# Patient Record
Sex: Female | Born: 1965 | Race: White | Hispanic: No | Marital: Single | Smoking: Never smoker
Health system: Southern US, Community
[De-identification: ages and names within clinical notes are randomized; demographics above are authoritative.]

## PROBLEM LIST (undated history)

## (undated) DIAGNOSIS — K5790 Diverticulosis of intestine, part unspecified, without perforation or abscess without bleeding: Secondary | ICD-10-CM

## (undated) DIAGNOSIS — L709 Acne, unspecified: Secondary | ICD-10-CM

## (undated) DIAGNOSIS — K635 Polyp of colon: Secondary | ICD-10-CM

## (undated) DIAGNOSIS — F32A Depression, unspecified: Secondary | ICD-10-CM

## (undated) DIAGNOSIS — T7840XA Allergy, unspecified, initial encounter: Secondary | ICD-10-CM

## (undated) DIAGNOSIS — K219 Gastro-esophageal reflux disease without esophagitis: Secondary | ICD-10-CM

## (undated) DIAGNOSIS — F329 Major depressive disorder, single episode, unspecified: Secondary | ICD-10-CM

## (undated) DIAGNOSIS — E079 Disorder of thyroid, unspecified: Secondary | ICD-10-CM

## (undated) DIAGNOSIS — K449 Diaphragmatic hernia without obstruction or gangrene: Secondary | ICD-10-CM

## (undated) DIAGNOSIS — E739 Lactose intolerance, unspecified: Secondary | ICD-10-CM

## (undated) DIAGNOSIS — E785 Hyperlipidemia, unspecified: Secondary | ICD-10-CM

## (undated) DIAGNOSIS — F419 Anxiety disorder, unspecified: Secondary | ICD-10-CM

## (undated) DIAGNOSIS — R946 Abnormal results of thyroid function studies: Secondary | ICD-10-CM

## (undated) DIAGNOSIS — Z975 Presence of (intrauterine) contraceptive device: Secondary | ICD-10-CM

## (undated) DIAGNOSIS — M1990 Unspecified osteoarthritis, unspecified site: Secondary | ICD-10-CM

## (undated) DIAGNOSIS — I10 Essential (primary) hypertension: Secondary | ICD-10-CM

## (undated) DIAGNOSIS — K2210 Ulcer of esophagus without bleeding: Secondary | ICD-10-CM

## (undated) HISTORY — DX: Ulcer of esophagus without bleeding: K22.10

## (undated) HISTORY — DX: Allergy, unspecified, initial encounter: T78.40XA

## (undated) HISTORY — DX: Essential (primary) hypertension: I10

## (undated) HISTORY — DX: Disorder of thyroid, unspecified: E07.9

## (undated) HISTORY — PX: BILATERAL KNEE ARTHROSCOPY: SUR91

## (undated) HISTORY — DX: Unspecified osteoarthritis, unspecified site: M19.90

## (undated) HISTORY — DX: Major depressive disorder, single episode, unspecified: F32.9

## (undated) HISTORY — DX: Diverticulosis of intestine, part unspecified, without perforation or abscess without bleeding: K57.90

## (undated) HISTORY — DX: Depression, unspecified: F32.A

## (undated) HISTORY — PX: TONSILLECTOMY AND ADENOIDECTOMY: SUR1326

## (undated) HISTORY — DX: Gastro-esophageal reflux disease without esophagitis: K21.9

## (undated) HISTORY — DX: Anxiety disorder, unspecified: F41.9

## (undated) HISTORY — PX: GANGLION CYST EXCISION: SHX1691

## (undated) HISTORY — DX: Lactose intolerance, unspecified: E73.9

## (undated) HISTORY — DX: Hyperlipidemia, unspecified: E78.5

## (undated) HISTORY — DX: Polyp of colon: K63.5

## (undated) HISTORY — DX: Abnormal results of thyroid function studies: R94.6

## (undated) HISTORY — PX: OTHER SURGICAL HISTORY: SHX169

## (undated) HISTORY — DX: Diaphragmatic hernia without obstruction or gangrene: K44.9

## (undated) HISTORY — DX: Presence of (intrauterine) contraceptive device: Z97.5

## (undated) HISTORY — DX: Acne, unspecified: L70.9

---

## 2000-06-26 ENCOUNTER — Other Ambulatory Visit: Admission: RE | Admit: 2000-06-26 | Discharge: 2000-06-26 | Payer: Self-pay | Admitting: Obstetrics and Gynecology

## 2001-07-25 ENCOUNTER — Other Ambulatory Visit: Admission: RE | Admit: 2001-07-25 | Discharge: 2001-07-25 | Payer: Self-pay | Admitting: Obstetrics and Gynecology

## 2002-07-28 ENCOUNTER — Inpatient Hospital Stay (HOSPITAL_COMMUNITY): Admission: AD | Admit: 2002-07-28 | Discharge: 2002-07-31 | Payer: Self-pay | Admitting: Obstetrics and Gynecology

## 2002-08-08 ENCOUNTER — Encounter: Admission: RE | Admit: 2002-08-08 | Discharge: 2002-09-07 | Payer: Self-pay | Admitting: Obstetrics and Gynecology

## 2002-09-09 ENCOUNTER — Other Ambulatory Visit: Admission: RE | Admit: 2002-09-09 | Discharge: 2002-09-09 | Payer: Self-pay | Admitting: Obstetrics and Gynecology

## 2003-11-03 ENCOUNTER — Other Ambulatory Visit: Admission: RE | Admit: 2003-11-03 | Discharge: 2003-11-03 | Payer: Self-pay | Admitting: Obstetrics and Gynecology

## 2004-10-06 ENCOUNTER — Ambulatory Visit: Payer: Self-pay | Admitting: Internal Medicine

## 2004-10-14 ENCOUNTER — Ambulatory Visit: Payer: Self-pay | Admitting: Internal Medicine

## 2004-12-19 ENCOUNTER — Other Ambulatory Visit: Admission: RE | Admit: 2004-12-19 | Discharge: 2004-12-19 | Payer: Self-pay | Admitting: Obstetrics and Gynecology

## 2005-03-15 ENCOUNTER — Ambulatory Visit: Payer: Self-pay | Admitting: Internal Medicine

## 2005-11-14 ENCOUNTER — Ambulatory Visit: Payer: Self-pay | Admitting: Internal Medicine

## 2007-01-16 ENCOUNTER — Encounter: Payer: Self-pay | Admitting: Internal Medicine

## 2007-01-23 ENCOUNTER — Ambulatory Visit: Payer: Self-pay | Admitting: Internal Medicine

## 2007-05-28 ENCOUNTER — Encounter: Payer: Self-pay | Admitting: Internal Medicine

## 2007-11-25 ENCOUNTER — Encounter: Payer: Self-pay | Admitting: Internal Medicine

## 2008-01-24 ENCOUNTER — Encounter: Payer: Self-pay | Admitting: Internal Medicine

## 2008-03-16 ENCOUNTER — Telehealth (INDEPENDENT_AMBULATORY_CARE_PROVIDER_SITE_OTHER): Payer: Self-pay | Admitting: *Deleted

## 2008-03-19 ENCOUNTER — Ambulatory Visit: Payer: Self-pay | Admitting: Internal Medicine

## 2008-03-24 DIAGNOSIS — J45909 Unspecified asthma, uncomplicated: Secondary | ICD-10-CM | POA: Insufficient documentation

## 2008-03-24 DIAGNOSIS — E739 Lactose intolerance, unspecified: Secondary | ICD-10-CM | POA: Insufficient documentation

## 2008-03-24 DIAGNOSIS — R946 Abnormal results of thyroid function studies: Secondary | ICD-10-CM | POA: Insufficient documentation

## 2008-03-24 DIAGNOSIS — Z9089 Acquired absence of other organs: Secondary | ICD-10-CM | POA: Insufficient documentation

## 2008-03-24 DIAGNOSIS — L719 Rosacea, unspecified: Secondary | ICD-10-CM | POA: Insufficient documentation

## 2008-03-26 ENCOUNTER — Ambulatory Visit: Payer: Self-pay | Admitting: Internal Medicine

## 2008-03-26 DIAGNOSIS — N943 Premenstrual tension syndrome: Secondary | ICD-10-CM | POA: Insufficient documentation

## 2008-03-26 DIAGNOSIS — E785 Hyperlipidemia, unspecified: Secondary | ICD-10-CM | POA: Insufficient documentation

## 2008-03-26 LAB — CONVERTED CEMR LAB
Direct LDL: 143.4 mg/dL
Free T4: 0.8 ng/dL (ref 0.6–1.6)
HDL: 73.8 mg/dL (ref >39.0)
Triglycerides: 105 mg/dL (ref 0–149)
VLDL: 21 mg/dL (ref 0–40)

## 2008-03-27 ENCOUNTER — Encounter: Payer: Self-pay | Admitting: Internal Medicine

## 2008-03-31 ENCOUNTER — Encounter (INDEPENDENT_AMBULATORY_CARE_PROVIDER_SITE_OTHER): Payer: Self-pay | Admitting: *Deleted

## 2008-04-14 ENCOUNTER — Telehealth (INDEPENDENT_AMBULATORY_CARE_PROVIDER_SITE_OTHER): Payer: Self-pay | Admitting: *Deleted

## 2008-05-04 ENCOUNTER — Ambulatory Visit: Payer: Self-pay | Admitting: Internal Medicine

## 2008-05-04 ENCOUNTER — Telehealth (INDEPENDENT_AMBULATORY_CARE_PROVIDER_SITE_OTHER): Payer: Self-pay | Admitting: *Deleted

## 2008-05-05 ENCOUNTER — Encounter: Payer: Self-pay | Admitting: Family Medicine

## 2008-05-07 ENCOUNTER — Telehealth (INDEPENDENT_AMBULATORY_CARE_PROVIDER_SITE_OTHER): Payer: Self-pay | Admitting: *Deleted

## 2008-05-08 ENCOUNTER — Encounter (INDEPENDENT_AMBULATORY_CARE_PROVIDER_SITE_OTHER): Payer: Self-pay | Admitting: *Deleted

## 2008-05-12 ENCOUNTER — Ambulatory Visit: Payer: Self-pay | Admitting: Internal Medicine

## 2008-06-08 ENCOUNTER — Encounter: Admission: RE | Admit: 2008-06-08 | Discharge: 2008-06-08 | Payer: Self-pay | Admitting: Obstetrics and Gynecology

## 2008-06-30 ENCOUNTER — Ambulatory Visit: Payer: Self-pay | Admitting: Internal Medicine

## 2008-08-05 ENCOUNTER — Ambulatory Visit: Payer: Self-pay | Admitting: Internal Medicine

## 2008-08-09 LAB — CONVERTED CEMR LAB
ALT: 14 units/L (ref 0–35)
Cholesterol: 197 mg/dL (ref 0–200)
LDL Cholesterol: 96 mg/dL (ref 0–99)
Total CHOL/HDL Ratio: 2.4
Total CK: 39 units/L (ref 7–177)
Triglycerides: 90 mg/dL (ref 0–149)
VLDL: 18 mg/dL (ref 0–40)

## 2008-08-10 ENCOUNTER — Encounter (INDEPENDENT_AMBULATORY_CARE_PROVIDER_SITE_OTHER): Payer: Self-pay | Admitting: *Deleted

## 2008-09-02 ENCOUNTER — Ambulatory Visit: Payer: Self-pay | Admitting: Internal Medicine

## 2009-01-07 ENCOUNTER — Encounter: Admission: RE | Admit: 2009-01-07 | Discharge: 2009-01-07 | Payer: Self-pay | Admitting: Physician Assistant

## 2009-01-27 ENCOUNTER — Encounter: Payer: Self-pay | Admitting: Internal Medicine

## 2009-03-08 ENCOUNTER — Telehealth (INDEPENDENT_AMBULATORY_CARE_PROVIDER_SITE_OTHER): Payer: Self-pay | Admitting: *Deleted

## 2009-03-10 ENCOUNTER — Telehealth (INDEPENDENT_AMBULATORY_CARE_PROVIDER_SITE_OTHER): Payer: Self-pay | Admitting: *Deleted

## 2009-05-12 ENCOUNTER — Ambulatory Visit: Payer: Self-pay | Admitting: Internal Medicine

## 2009-05-12 DIAGNOSIS — M1990 Unspecified osteoarthritis, unspecified site: Secondary | ICD-10-CM | POA: Insufficient documentation

## 2009-05-12 DIAGNOSIS — L259 Unspecified contact dermatitis, unspecified cause: Secondary | ICD-10-CM | POA: Insufficient documentation

## 2009-05-12 DIAGNOSIS — F329 Major depressive disorder, single episode, unspecified: Secondary | ICD-10-CM

## 2009-05-12 DIAGNOSIS — IMO0001 Reserved for inherently not codable concepts without codable children: Secondary | ICD-10-CM | POA: Insufficient documentation

## 2009-05-12 DIAGNOSIS — F32A Depression, unspecified: Secondary | ICD-10-CM | POA: Insufficient documentation

## 2009-05-12 DIAGNOSIS — M2550 Pain in unspecified joint: Secondary | ICD-10-CM | POA: Insufficient documentation

## 2009-05-12 HISTORY — DX: Depression, unspecified: F32.A

## 2009-05-13 ENCOUNTER — Encounter: Payer: Self-pay | Admitting: Internal Medicine

## 2009-05-13 ENCOUNTER — Encounter (INDEPENDENT_AMBULATORY_CARE_PROVIDER_SITE_OTHER): Payer: Self-pay | Admitting: *Deleted

## 2009-05-27 ENCOUNTER — Encounter (INDEPENDENT_AMBULATORY_CARE_PROVIDER_SITE_OTHER): Payer: Self-pay | Admitting: *Deleted

## 2009-05-27 ENCOUNTER — Telehealth (INDEPENDENT_AMBULATORY_CARE_PROVIDER_SITE_OTHER): Payer: Self-pay | Admitting: *Deleted

## 2009-06-04 ENCOUNTER — Ambulatory Visit: Payer: Self-pay | Admitting: Internal Medicine

## 2009-06-07 ENCOUNTER — Encounter (INDEPENDENT_AMBULATORY_CARE_PROVIDER_SITE_OTHER): Payer: Self-pay | Admitting: *Deleted

## 2009-07-08 ENCOUNTER — Encounter: Admission: RE | Admit: 2009-07-08 | Discharge: 2009-07-08 | Payer: Self-pay | Admitting: Obstetrics and Gynecology

## 2009-07-30 ENCOUNTER — Ambulatory Visit: Payer: Self-pay | Admitting: Internal Medicine

## 2009-08-01 LAB — CONVERTED CEMR LAB
Albumin: 3.7 g/dL (ref 3.5–5.2)
Alkaline Phosphatase: 51 units/L (ref 39–117)
Cholesterol: 199 mg/dL (ref 0–200)
HDL: 76.6 mg/dL (ref >39.00)
LDL Cholesterol: 105 mg/dL — ABNORMAL HIGH (ref 0–99)
T3, Free: 2.7 pg/mL (ref 2.3–4.2)
TSH: 0.6 microintl units/mL (ref 0.35–5.50)
Total Protein: 6.4 g/dL (ref 6.0–8.3)
Triglycerides: 87 mg/dL (ref 0.0–149.0)

## 2009-08-02 ENCOUNTER — Encounter (INDEPENDENT_AMBULATORY_CARE_PROVIDER_SITE_OTHER): Payer: Self-pay | Admitting: *Deleted

## 2009-08-17 ENCOUNTER — Ambulatory Visit: Payer: Self-pay | Admitting: Internal Medicine

## 2009-09-06 ENCOUNTER — Telehealth (INDEPENDENT_AMBULATORY_CARE_PROVIDER_SITE_OTHER): Payer: Self-pay | Admitting: *Deleted

## 2010-01-10 IMAGING — CR DG HAND COMPLETE 3+V*R*
3 series · 3 of 3 positions shown · non-contrast
Comparison: None available.

CLINICAL DATA: Hand pain.

RIGHT HAND - COMPLETE 3+ VIEW

[view not recorded (1 of 3)]
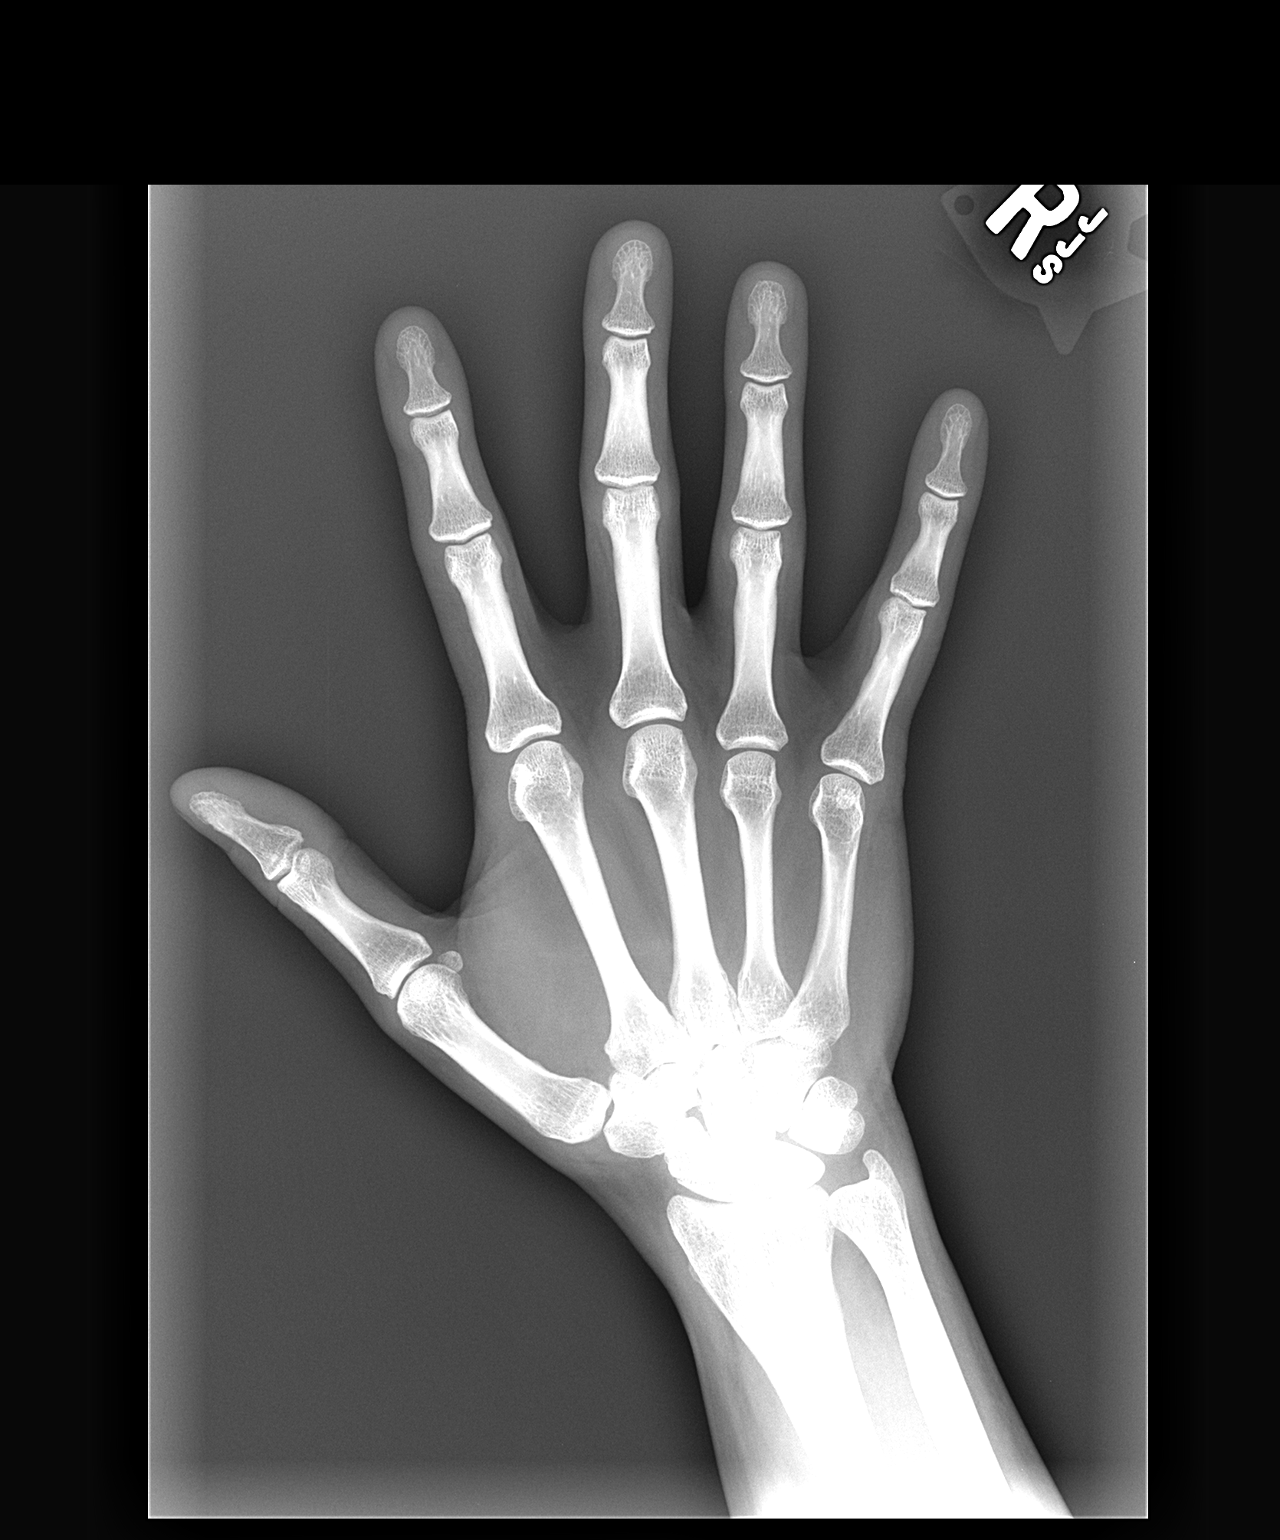

[view not recorded (2 of 3)]
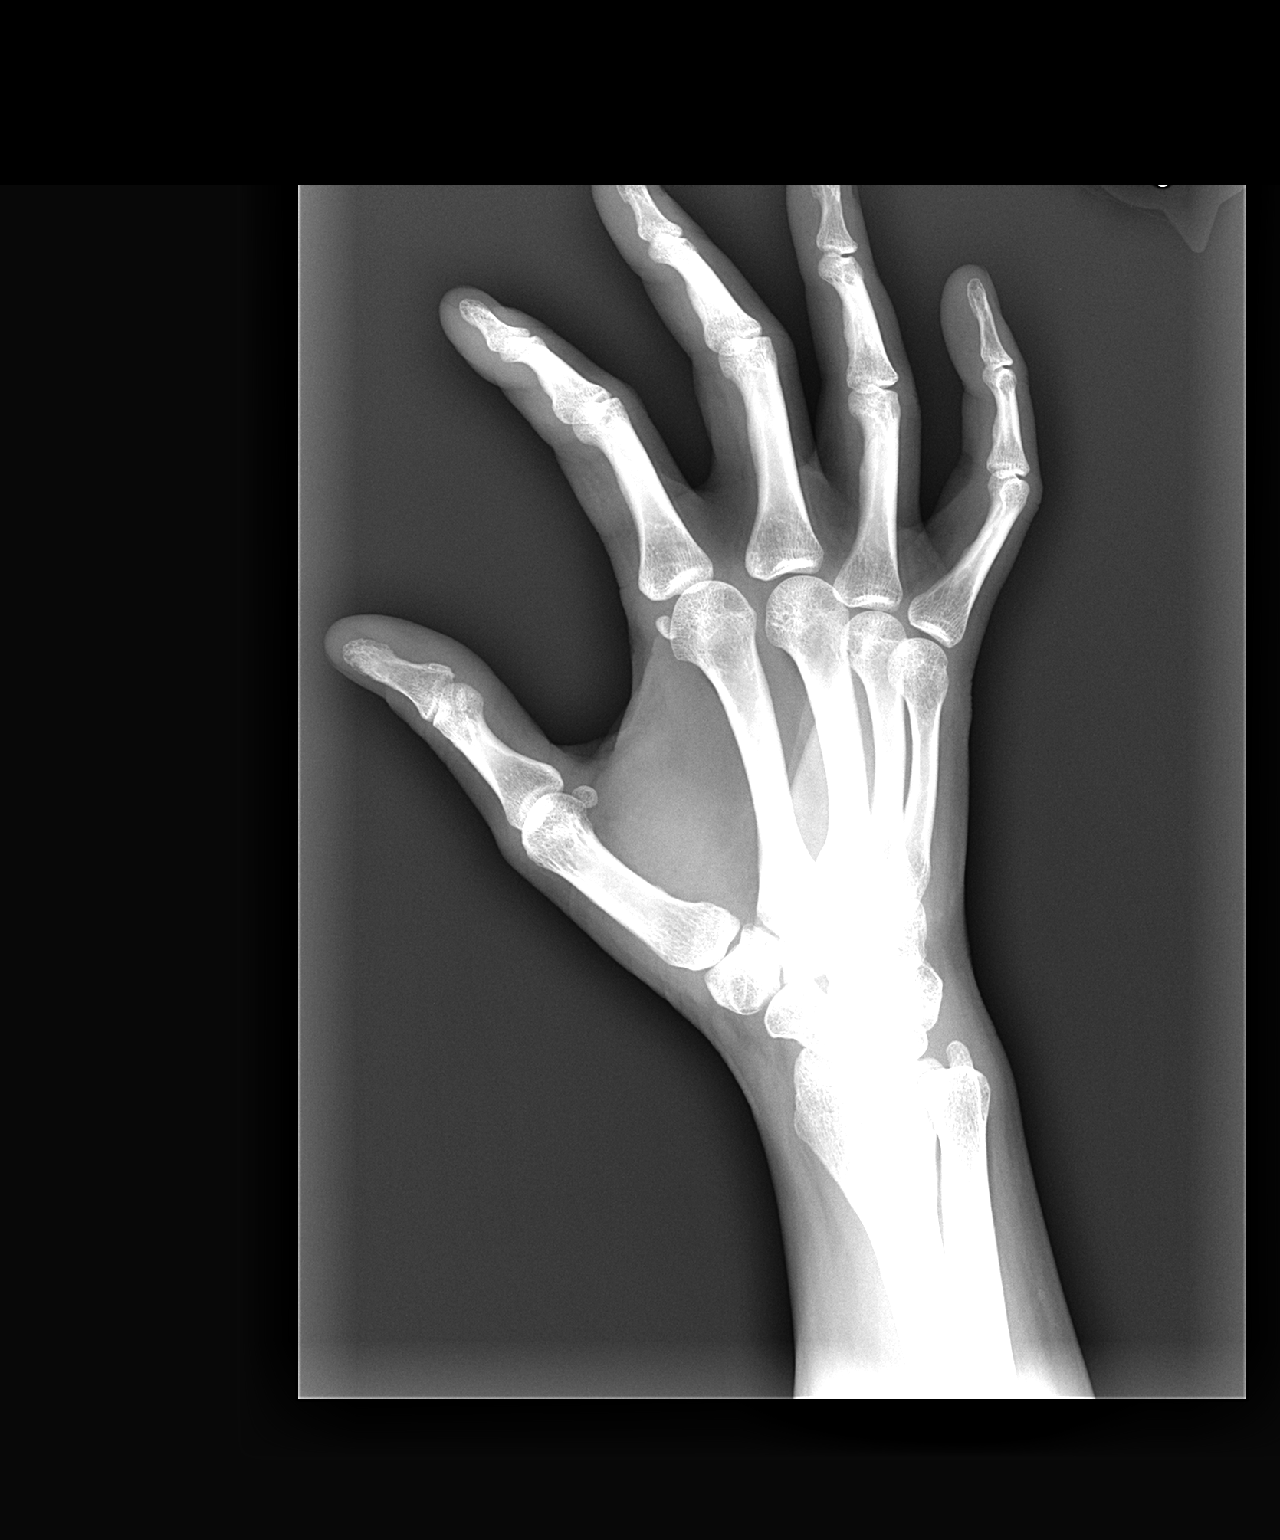

[view not recorded (3 of 3)]
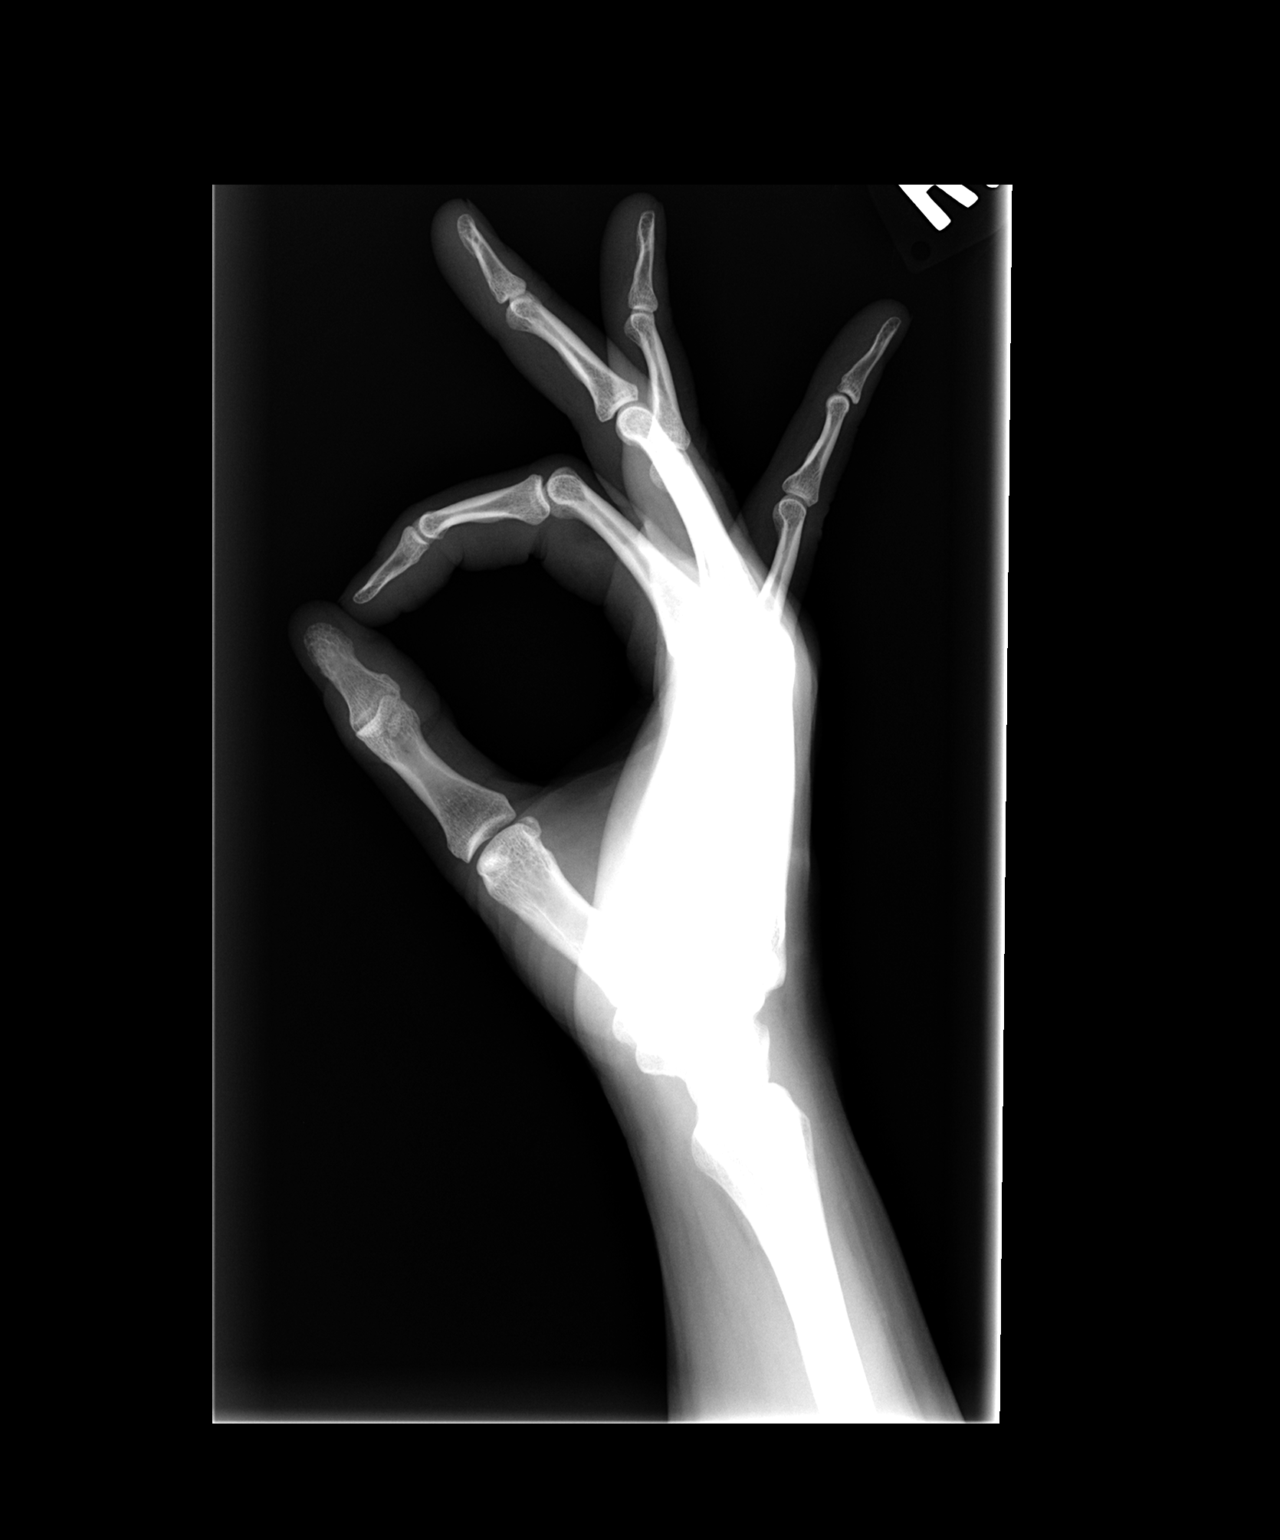

[3 of 3 positions shown; findings below may reference images not displayed]

FINDINGS: Imaged bones, joints and soft tissues appear normal.
IMPRESSION: Negative exam.

## 2010-01-10 IMAGING — CR DG HAND COMPLETE 3+V*L*
3 series · 3 of 3 positions shown · non-contrast
Comparison: None available.

CLINICAL DATA: Hand pain.

LEFT HAND - COMPLETE 3+ VIEW

[view not recorded (1 of 3)]
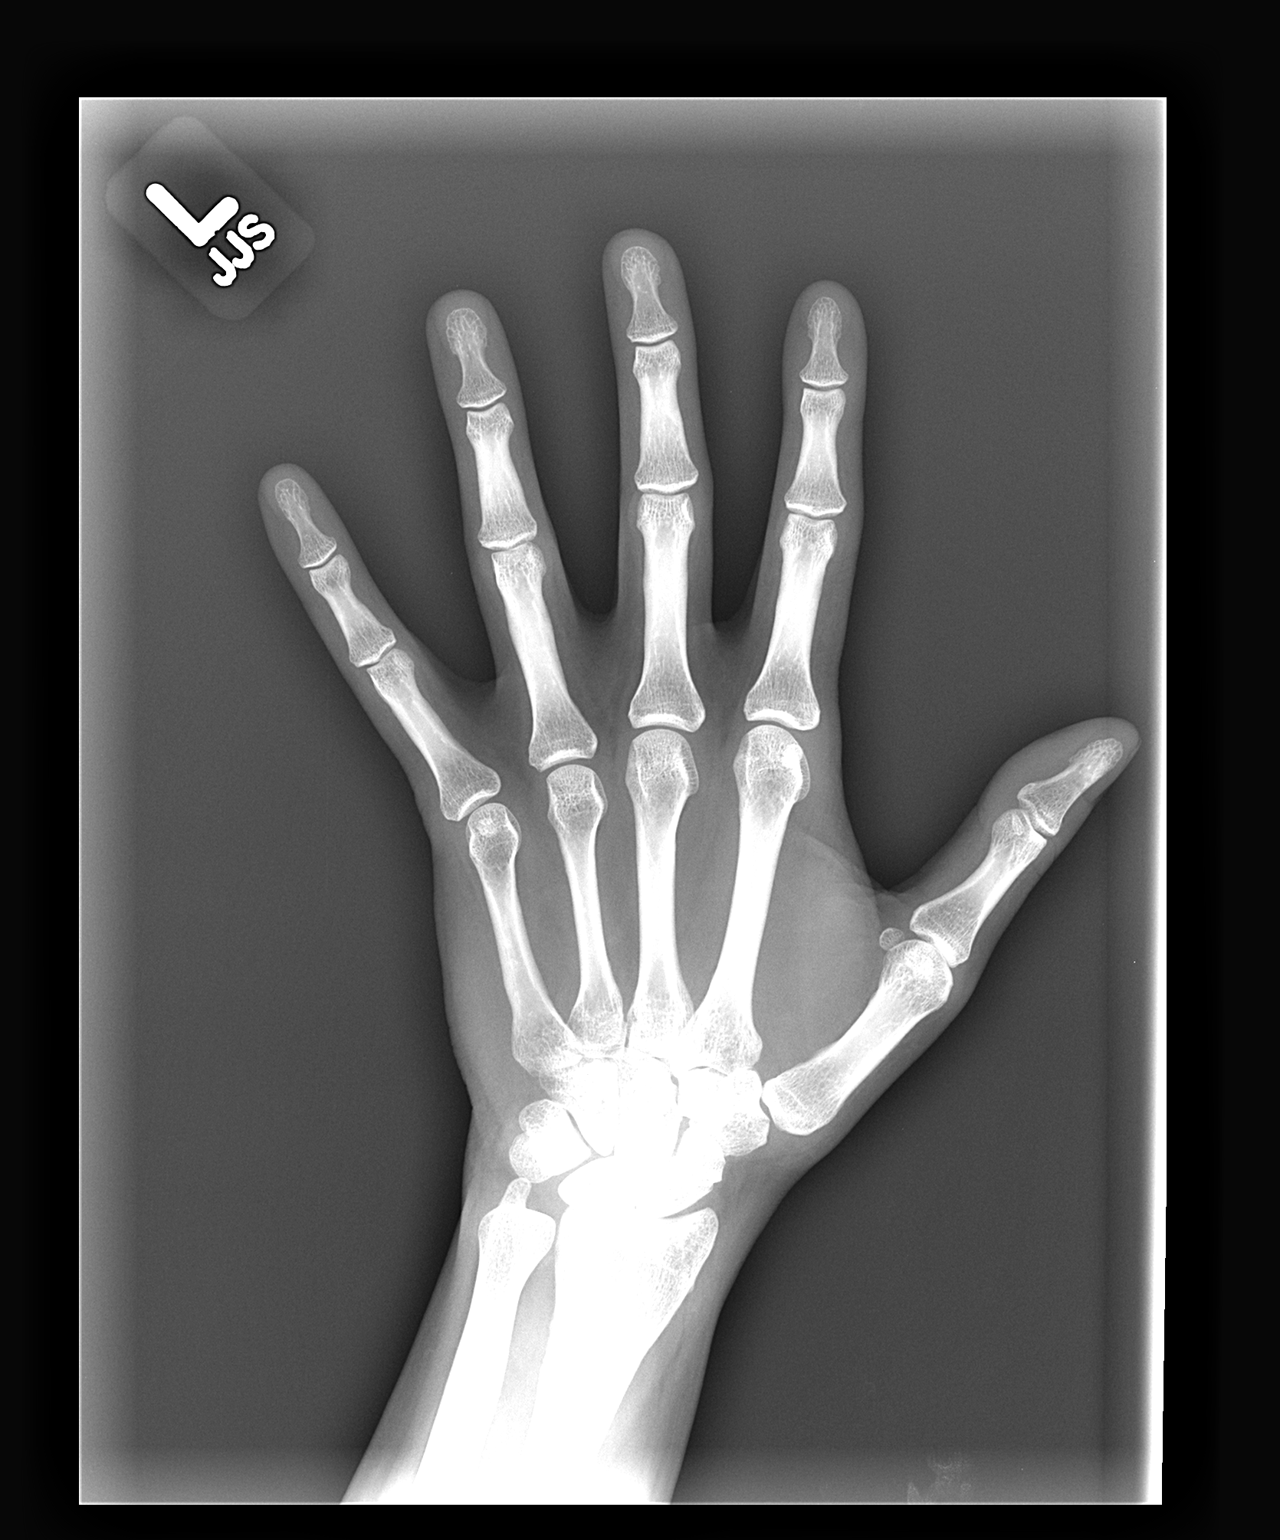

[view not recorded (2 of 3)]
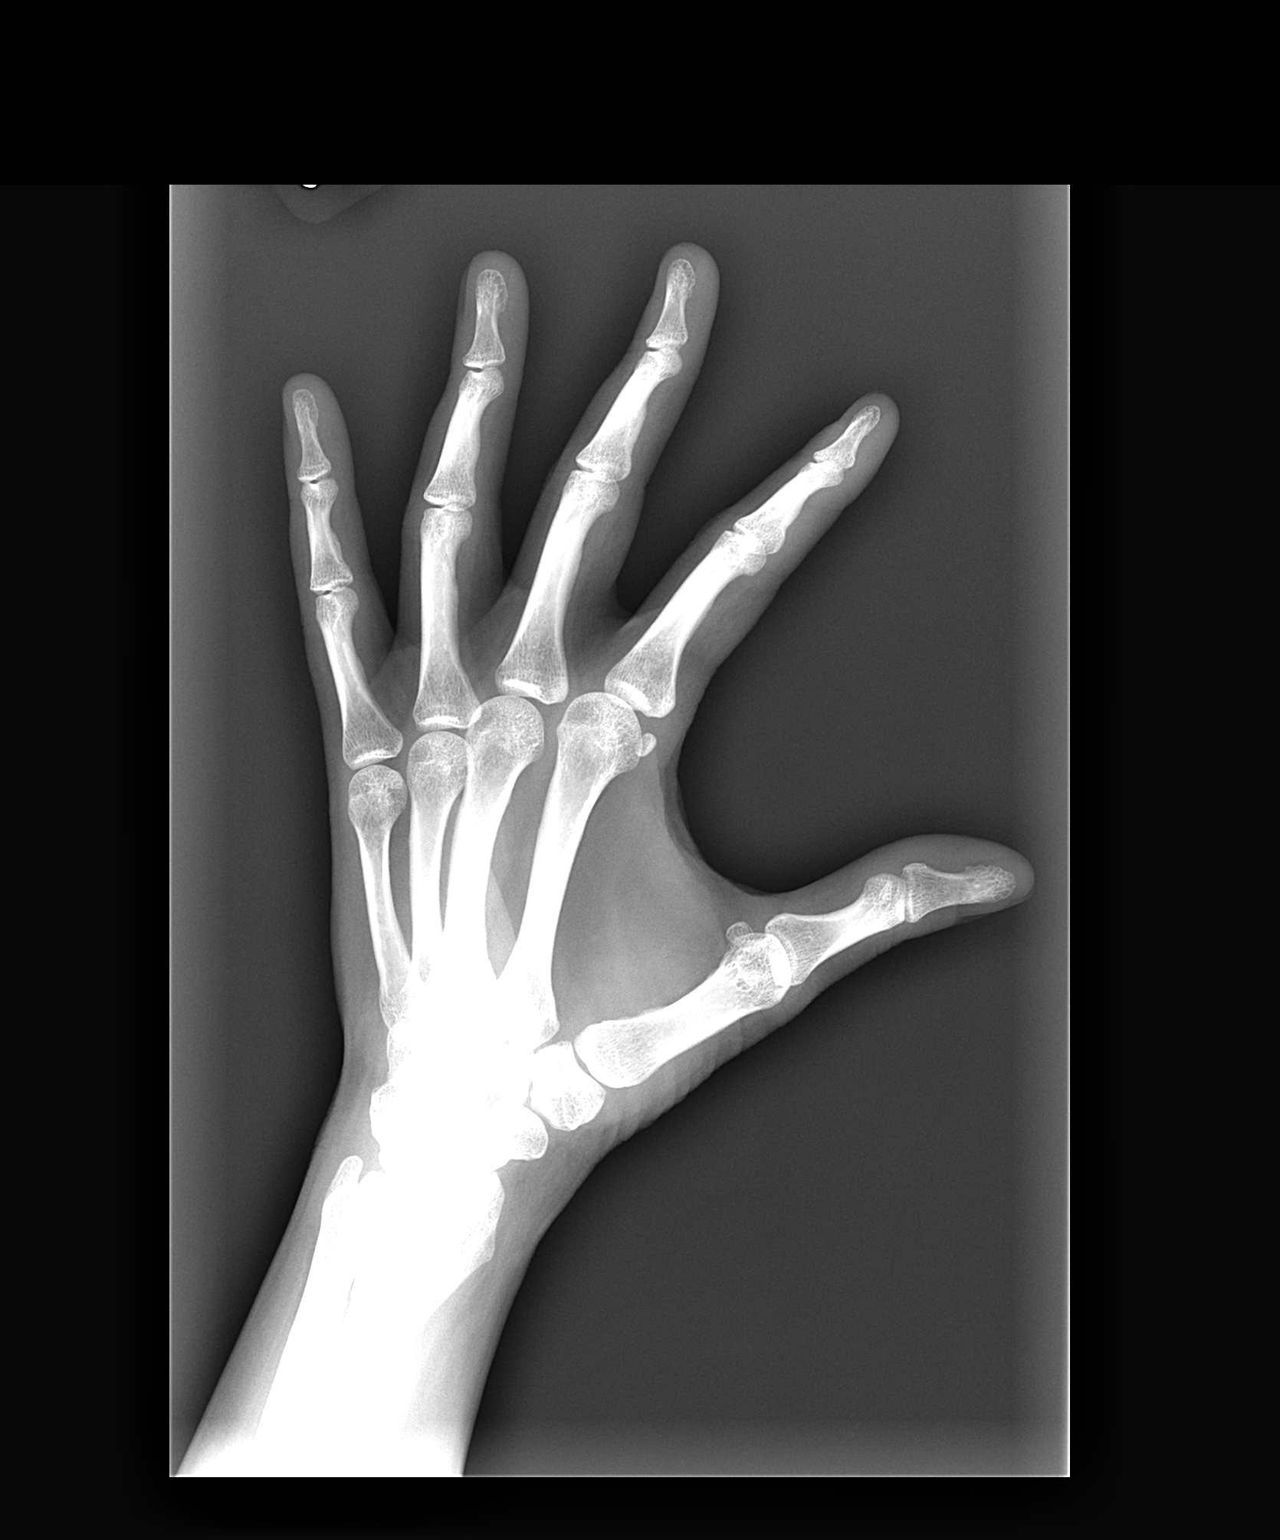

[view not recorded (3 of 3)]
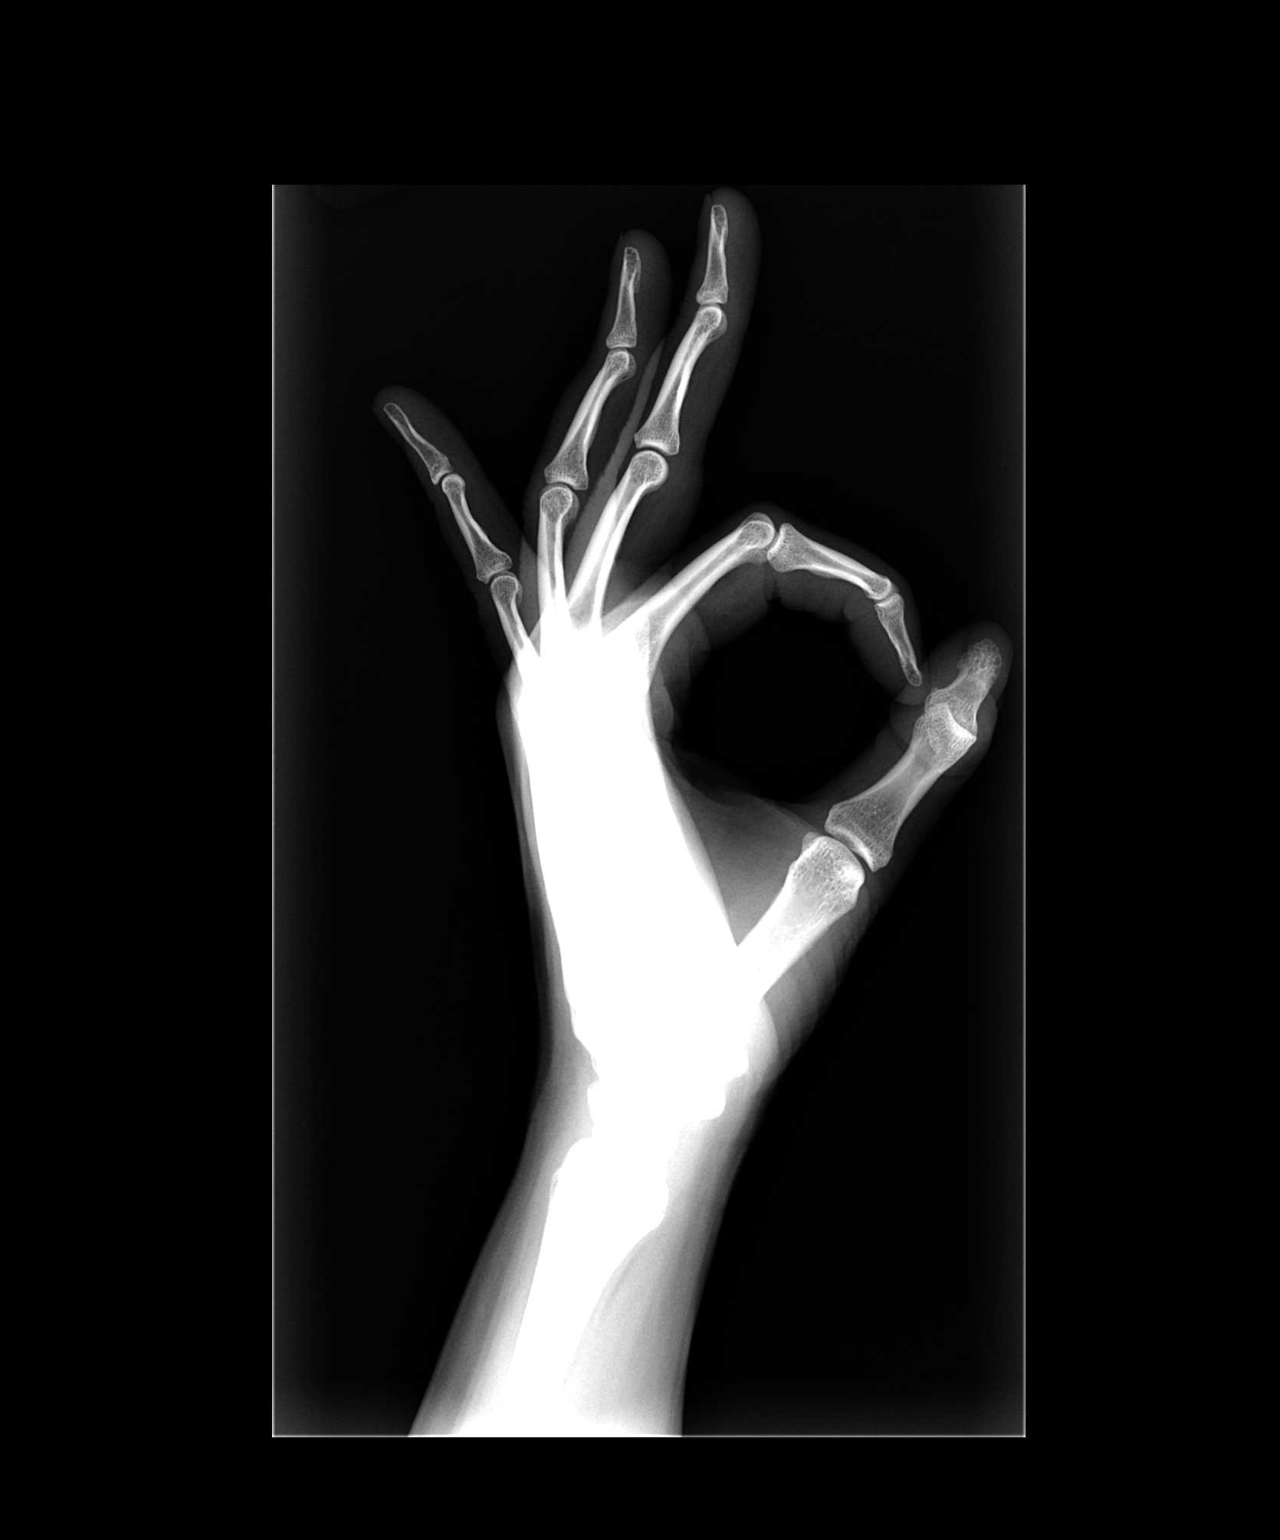

[3 of 3 positions shown; findings below may reference images not displayed]

FINDINGS: Imaged bones, joints and soft tissues appear normal.
IMPRESSION: Negative exam.

## 2010-01-12 ENCOUNTER — Encounter: Payer: Self-pay | Admitting: Internal Medicine

## 2010-01-13 ENCOUNTER — Telehealth (INDEPENDENT_AMBULATORY_CARE_PROVIDER_SITE_OTHER): Payer: Self-pay | Admitting: *Deleted

## 2010-01-18 ENCOUNTER — Ambulatory Visit: Payer: Self-pay | Admitting: Internal Medicine

## 2010-01-18 DIAGNOSIS — R42 Dizziness and giddiness: Secondary | ICD-10-CM | POA: Insufficient documentation

## 2010-04-14 ENCOUNTER — Telehealth (INDEPENDENT_AMBULATORY_CARE_PROVIDER_SITE_OTHER): Payer: Self-pay | Admitting: *Deleted

## 2010-06-16 ENCOUNTER — Telehealth (INDEPENDENT_AMBULATORY_CARE_PROVIDER_SITE_OTHER): Payer: Self-pay | Admitting: *Deleted

## 2010-09-21 ENCOUNTER — Encounter: Payer: Self-pay | Admitting: Internal Medicine

## 2010-09-21 ENCOUNTER — Ambulatory Visit: Payer: Self-pay | Admitting: Internal Medicine

## 2010-10-03 ENCOUNTER — Encounter: Payer: Self-pay | Admitting: Internal Medicine

## 2010-10-19 ENCOUNTER — Telehealth: Payer: Self-pay | Admitting: Internal Medicine

## 2010-11-03 ENCOUNTER — Encounter: Payer: Self-pay | Admitting: Internal Medicine

## 2010-12-18 LAB — CONVERTED CEMR LAB
ALT: 21 units/L (ref 0–35)
AST: 34 units/L (ref 0–37)
Albumin: 4 g/dL (ref 3.5–5.2)
Alkaline Phosphatase: 38 units/L — ABNORMAL LOW (ref 39–117)
Basophils Absolute: 0 10*3/uL (ref 0.0–0.1)
Basophils Absolute: 0 10*3/uL (ref 0.0–0.1)
Basophils Relative: 0.4 % (ref 0.0–3.0)
Calcium: 9 mg/dL (ref 8.4–10.5)
Calcium: 9.4 mg/dL (ref 8.4–10.5)
Eosinophils Absolute: 0.3 10*3/uL (ref 0.0–0.6)
Eosinophils Relative: 0.8 % (ref 0.0–5.0)
Free T4: 0.7 ng/dL (ref 0.6–1.6)
GFR calc non Af Amer: 97.46 mL/min (ref >60)
Glucose, Bld: 98 mg/dL (ref 70–99)
HCT: 37.1 % (ref 36.0–46.0)
HDL: 100.6 mg/dL (ref >39.00)
Hemoglobin: 13 g/dL (ref 12.0–15.0)
Lymphocytes Relative: 16.5 % (ref 12.0–46.0)
Lymphs Abs: 1.4 10*3/uL (ref 0.7–4.0)
MCHC: 35 g/dL (ref 30.0–36.0)
MCV: 94.8 fL (ref 78.0–100.0)
Magnesium: 2 mg/dL (ref 1.5–2.5)
Monocytes Relative: 4.4 % (ref 3.0–12.0)
Monocytes Relative: 5.8 % (ref 3.0–11.0)
Neutro Abs: 6.3 10*3/uL (ref 1.4–7.7)
Platelets: 231 10*3/uL (ref 150–400)
Potassium: 4.2 meq/L (ref 3.5–5.1)
RBC: 3.86 M/uL — ABNORMAL LOW (ref 3.87–5.11)
RBC: 3.94 M/uL (ref 3.87–5.11)
RDW: 11.9 % (ref 11.5–14.6)
Rhuematoid fact SerPl-aCnc: <20 intl units/mL (ref 0.0–20.0)
Sodium: 141 meq/L (ref 135–145)
TSH: 0.32 microintl units/mL — ABNORMAL LOW (ref 0.35–5.50)
Total CHOL/HDL Ratio: 2
Total Protein: 7.4 g/dL (ref 6.0–8.3)
Triglycerides: 80 mg/dL (ref 0.0–149.0)

## 2010-12-20 NOTE — Progress Notes (Signed)
Summary: RX  Phone Note Call from Patient Call back at [facility] Phone [phone]   Caller: Patient Summary of Call: PT CALL SAYING SHE NEED THE SCRIPT TO BE WRITTEN OR SEND IN  TABLETS  NOT CAPSULE . CAN YOU PLEASE SEND A NEW SCRIPT WRITTEN FOR TABLETS Initial call taken by: Freddy Jaksch,  June 16, 2010 11:12 AM  Follow-up for Phone Call        I called the pharmacy and informed them to change to a tab, when I keyed in efferox the only option was a cap but once I keyed in the generic it did come up as a tab  I called and informed patient I called the pharmacy and advised patient again she is due for a yearly wellness check. Patient stated she will call back to schedule b/c she didnt have her calender in front of her Follow-up by: Shonna Chock CMA,  June 16, 2010 11:42 AM    New/Updated Medications: VENLAFAXINE HCL 75 MG TABS (VENLAFAXINE HCL) 1 by mouth once daily

## 2010-12-20 NOTE — Progress Notes (Signed)
Summary: Refill Request  Phone Note Refill Request Call back at Home Phone [phone] Message from:  Patient  Refills Requested: Medication #1:  CRESTOR 20 MG TABS take as directed. [facility]   Method Requested: Fax to Local Pharmacy Initial call taken by: Shonna Chock,  January 13, 2010 4:52 PM    Prescriptions: CRESTOR 20 MG TABS (ROSUVASTATIN CALCIUM) take as directed  #30 x 6   Entered by:   Shonna Chock   Authorized by:   Marga Melnick MD   Signed by:   Shonna Chock on 01/13/2010   Method used:   Electronically to        [facility] [phone]* (retail)       [address]       [place], [place]  [address]       Ph: [phone]       Fax: [phone]   RxID:   [id number]

## 2010-12-20 NOTE — Progress Notes (Signed)
Summary: Rx Change  Phone Note Call from Patient Call back at Home Phone [phone]   Caller: Patient Summary of Call: Patient called and LM on triage VM asking for a new rx for her Vanlafaxine HCL 75mg . She would like this in tablets if it is avaible because it would be cheaper. Her pharmacy is [facility] [address]. She would like a call to let her know that if this is possible or if it was sent.  Initial call taken by: Harold Barban,  June 16, 2010 8:42 AM  Follow-up for Phone Call        Left mesaage on VM informing patient rx sent to pharmacy, it only comes up in our system as a Cap, I also left message informing patient that she is due for a yearly wellness check Follow-up by: Shonna Chock CMA,  June 16, 2010 10:26 AM    Prescriptions: EFFEXOR XR 75 MG  CP24 (VENLAFAXINE HCL) 1 by mouth once daily GENERIC OK  #90 x 0   Entered by:   Shonna Chock CMA   Authorized by:   Marga Melnick MD   Signed by:   Shonna Chock CMA on 06/16/2010   Method used:   Faxed to ...       [facility] [address] (retail)       [phone]       [place], [place]  [address]       Ph: [phone]       Fax: [phone]   RxID:   [id number]

## 2010-12-20 NOTE — Assessment & Plan Note (Signed)
Summary: dizzy spells//lch   Vital Signs:  Patient profile:   45 year old female Weight:      185 pounds Temp:     98.3 degrees F oral Pulse rate:   88 / minute Resp:     16 per minute BP supine:   124 / 80  (left arm) BP sitting:   128 / 80  (left arm) BP standing:   124 / 82  (left arm) Cuff size:   large  Vitals Entered By: Shonna Chock (January 18, 2010 3:05 PM) CC: 1.) Dizzy: Onset yesterday, patient dizzy when turning head and ears feel funny (? Sinuses). Patient also felt nauseated yesterday  2.) New Rx's for Effexor and Crestor for new insurance Comments REVIEWED MED LIST, PATIENT AGREED DOSE AND INSTRUCTION CORRECT    CC:  1.) Dizzy: Onset yesterday and patient dizzy when turning head and ears feel funny (? Sinuses). Patient also felt nauseated yesterday  2.) New Rx's for Effexor and Crestor for new insurance.  History of Present Illness: Acute onset of dizziness 01/17/2010; intermittent recurrences for seconds with nausea through day. Today worse as more "balloon feeling " in head. No triggers or relievers. No prior hx of vertigo or BPV.  Allergies: 1)  ! Penicillin  Review of Systems General:  Denies chills, fever, and sweats; Flushing. Eyes:  Complains of blurring; denies double vision and vision loss-both eyes. ENT:  Denies decreased hearing, nasal congestion, ringing in ears, and sinus pressure; No frontal headaches , facial pain, purulence. CV:  Denies chest pain or discomfort and palpitations. Derm:  Denies lesion(s) and rash. Neuro:  Denies brief paralysis, disturbances in coordination, falling down, inability to speak, numbness, poor balance, sensation of room spinning, tingling, tremors, and weakness; Mild pressure. Psych:  Complains of anxiety; Work stress. Allergy:  Denies itching eyes and sneezing.  Physical Exam  General:  well-nourished,in no acute distress; alert,appropriate and cooperative throughout examination Eyes:  No corneal or conjunctival  inflammation noted. EOMI. Perrla. Field of  Vision grossly normal.No nystagmus Ears:  External ear exam shows no significant lesions or deformities.  Otoscopic examination reveals clear canals, tympanic membranes are intact bilaterally without bulging, retraction, inflammation or discharge. Hearing is grossly normal bilaterally.Tuning fork exam WNL Nose:  External nasal examination shows no deformity or inflammation. Nasal mucosa are pink and moist without lesions or exudates. Mouth:  Oral mucosa and oropharynx without lesions or exudates.  Teeth in good repair. Heart:  Normal rate and regular rhythm. S1 and S2 normal without gallop, murmur, click, rub or other extra sounds. Pulses:  R and L carotid  pulses are full and equal bilaterallyw/o bruits Extremities:  No clubbing, cyanosis, edema, or deformity noted  Neurologic:  alert & oriented X3, cranial nerves II-XII intact, strength normal in all extremities, sensation intact to light touch, gait (heel & toe)normal, DTRs symmetrical and normal, finger-to-nose normal, heel-to-shin normal, and Romberg negative.  Rapid speech & repetitive motion WNL. No tremor Skin:  Intact without suspicious lesions or rashes Cervical Nodes:  No lymphadenopathy noted Axillary Nodes:  No palpable lymphadenopathy Psych:  memory intact for recent and remote, normally interactive, good eye contact, and not anxious appearing.     Impression & Recommendations:  Problem # 1:  DIZZINESS (ICD-780.4)  Her updated medication list for this problem includes:    Xyzal 5 Mg Tabs (Levocetirizine dihydrochloride) .Marland Kitchen... 1 by mouth once daily  Complete Medication List: 1)  Effexor Xr 75 Mg Cp24 (venlafaxine Hcl)  .Marland KitchenMarland KitchenMarland Kitchen 1  by mouth once daily generic ok 2)  Singulair 10 Mg Tabs (Montelukast sodium) .... Qd 3)  Xyzal 5 Mg Tabs (Levocetirizine dihydrochloride) .Marland Kitchen.. 1 by mouth once daily 4)  Symbicort 160-4.5 Mcg/act Aero (Budesonide-formoterol fumarate) .... Use one puff twice daily  rx'd by allergist. 5)  Xopenex Hfa 45 Mcg/act Aero (Levalbuterol tartrate) .... As needed 2 puffs. 6)  Yaz 3-0.02 Mg Tabs (Drospirenone-ethinyl estradiol) .Marland Kitchen.. 1 by mouth once daily 7)  Crestor 20 Mg Tabs (Rosuvastatin calcium) .... Take as directed 8)  Lorazepam 0.5 Mg Tabs (Lorazepam) .Marland Kitchen.. 1 q 8 hrs as needed  Patient Instructions: 1)  Please obtain recent  labs from Dr Henderson Cloud Prescriptions: LORAZEPAM 0.5 MG TABS (LORAZEPAM) 1 q 8 hrs as needed  #30 x 1   Entered and Authorized by:   Marga Melnick MD   Signed by:   Marga Melnick MD on 01/18/2010   Method used:   Print then Give to Patient   RxID:   [id number]

## 2010-12-20 NOTE — Medication Information (Signed)
Summary: Nonadherece with Venlafaxine/[facility]  Nonadherece with Venlafaxine/[facility]   Imported By: Lanelle Bal 10/14/2010 08:10:53  _____________________________________________________________________  External Attachment:    Type:   Image     Comment:   External Document

## 2010-12-20 NOTE — Assessment & Plan Note (Signed)
Summary: CPX, refill meds/scm   Vital Signs:  Patient profile:   45 year old female Height:      65.5 inches Weight:      166.2 pounds BMI:     27.33 Temp:     98.3 degrees F oral Pulse rate:   94 / minute Resp:     16 per minute BP sitting:   116 / 70  (left arm) Cuff size:   large  Vitals Entered By: Shonna Chock CMA (September 21, 2010 2:13 PM)  CC: CPX, COPD follow-up, General Medical Evaluation, Lipid Management   CC:  CPX, COPD follow-up, General Medical Evaluation, and Lipid Management.  History of Present Illness:       Ms. Misty [name] is here for a physical; she is essentially asymptomatic Hyperlipidemia Follow-Up       The patient denies muscle aches, GI upset, abdominal pain, flushing, itching, constipation, diarrhea, and fatigue.  The patient denies the following symptoms: chest pain/pressure, exercise intolerance, dypsnea, palpitations, syncope, and pedal edema.  Compliance with medications (by patient report) has been near 100%.  Dietary compliance has been excellent.  The patient reports exercising  2-3 X per week.  Adjunctive measures currently used by the patient include folic acid and fish oil supplements.  She also takes Glucosamine.  Asthma  Follow-Up       The patient reports  minor wheezing, but denies shortness of breath, chest tightness, cough, increased sputum, heat intolerance, nocturnal awakening, and exercise induced symptoms.  The patient reports no limitation in activities.  Current treatment includes MDI with spacer(albutrol).  Quick relief med used rarely  and controller meds ( Singulair & Symbicort) daily.  Symptoms are worse with pollen, cold, dust, animal exposure, and certain fumes or chemicals.    Lipid Management History:      Negative NCEP/ATP III risk factors include female age less than 35 years old, non-diabetic, HDL cholesterol greater than 60, no family history for ischemic heart disease, non-tobacco-user status, non-hypertensive, no ASHD  (atherosclerotic heart disease), no prior stroke/TIA, no peripheral vascular disease, and no history of aortic aneurysm.     Current Medications (verified): 1)  Singulair 10 Mg  Tabs (Montelukast Sodium) .... Qd 2)  Xyzal 5 Mg Tabs (Levocetirizine Dihydrochloride) .Marland Kitchen.. 1 By Mouth Once Daily As Needed 3)  Symbicort 160-4.5 Mcg/act  Aero (Budesonide-Formoterol Fumarate) .... Use One Puff Twice Daily Rx'd By Allergist. 4)  Xopenex Hfa 45 Mcg/act  Aero (Levalbuterol Tartrate) .... As Needed 2 Puffs. 5)  Crestor 20 Mg Tabs (Rosuvastatin Calcium) .... Take As Directed 6)  Lorazepam 0.5 Mg Tabs (Lorazepam) .Marland Kitchen.. 1 Q 8 Hrs As Needed 7)  Venlafaxine Hcl 75 Mg Tabs (Venlafaxine Hcl) .Marland Kitchen.. 1 By Mouth Once Daily  Allergies: 1)  ! Penicillin  Past History:  Past Medical History: ABNORMAL THYROID FUNCTION TESTS (ICD-794.5), PMH of  LACTOSE INTOLERANCE (ICD-271.3) ROSACEA (ICD-695.3) ASTHMA (ICD-493.90) ABSCESS, SKIN (ICD-682.9), PMH of  Osteoarthritis, "Stage 3 , on 4 scale" , Dr Thomasena Edis Hyperlipidemia: Framingham Study LDL goal < 160.  Past Surgical History: TONSILLECTOMY AND ADENOIDECTOMY, PMH  OF (ICD-V45.79) GANGLION CYST, WRIST, RIGHT (ICD-727.41) ARTHROSCOPY OF KNEE,BILATERALLY , L 11/10/2008 & R 03/2719/10, Dr  Hayden Rasmussen,   G 1 P 1  Family History: Father: CAD, MI @ 82, Mesothelioma( Asbestos exposure @RR  & DOT) Mother: COAD,?DM,CAD (stent), tachycardia,S/P ablation Siblings: sister : Osteopenia,elevated  lipids ; MGF: CVA;MGM :lung cancer ;M aunt: DM; PGM: breast  cancer; M aunt : DM  Social History: Never  Smoked Alcohol use-yes: socially Regular exercise-yes: 2-3X/week as cardio Occupation: Unemployed  Review of Systems  The patient denies anorexia, fever, vision loss, decreased hearing, hoarseness, abdominal pain, melena, hematochezia, severe indigestion/heartburn, hematuria, suspicious skin lesions, unusual weight change, enlarged lymph nodes, and angioedema.          Weight loss of 27 # with TLC. Some  easy bruising ; on Aleve. Psych:  Complains of anxiety, depression, easily angered, easily tearful, and irritability; denies panic attacks; Pre menses mood changes . Allergy:  Complains of itching eyes and sneezing; Only with triggers.  Physical Exam  General:  well-nourished;alert,appropriate and cooperative throughout examination Head:  Normocephalic and atraumatic without obvious abnormalities.  Eyes:  No corneal or conjunctival inflammation noted. Perrla. Funduscopic exam benign, without hemorrhages, exudates or papilledema.  Ears:  External ear exam shows no significant lesions or deformities.  Otoscopic examination reveals clear canals, tympanic membranes are intact bilaterally without bulging, retraction, inflammation or discharge. Hearing is grossly normal bilaterally. Nose:  External nasal examination shows no deformity or inflammation. Nasal mucosa are pink and moist without lesions or exudates. Mouth:  Oral mucosa and oropharynx without lesions or exudates.  Teeth in good repair. Neck:  No deformities, masses, or tenderness noted. Lungs:  Normal respiratory effort, chest expands symmetrically. Lungs are clear to auscultation, no crackles or wheezes. Heart:  Normal rate and regular rhythm. S1 and S2 normal without gallop, murmur, click, rub . S4 sounds. Abdomen:  Bowel sounds positive,abdomen soft and non-tender without masses, organomegaly or hernias noted. Aorta palpable w/o AAA Msk:  No deformity or scoliosis noted of thoracic or lumbar spine.   Pulses:  R and L carotid,radial,femoral,dorsalis pedis and posterior tibial pulses are full and equal bilaterally Extremities:  No clubbing, cyanosis, edema. Crepitus of knees Neurologic:  alert & oriented X3 and DTRs symmetrical and normal.   Skin:  Intact without suspicious lesions or rashes Cervical Nodes:  No lymphadenopathy noted Axillary Nodes:  No palpable lymphadenopathy Psych:  memory intact for  recent and remote, normally interactive, and good eye contact.     Impression & Recommendations:  Problem # 1:  ROUTINE GENERAL MEDICAL EXAM@HEALTH  CARE FACL (ICD-V70.0)  Orders: EKG w/ Interpretation (93000)  Problem # 2:  HYPERLIPIDEMIA (ICD-272.4)  Her updated medication list for this problem includes:    Crestor 20 Mg Tabs (Rosuvastatin calcium) .Marland Kitchen... Take as directed  Problem # 3:  ASTHMA (ICD-493.90)  Her updated medication list for this problem includes:    Singulair 10 Mg Tabs (Montelukast sodium) ..... Qd    Symbicort 160-4.5 Mcg/act Aero (Budesonide-formoterol fumarate) ..... Use one puff twice daily rx'd by allergist.    Xopenex Hfa 45 Mcg/act Aero (Levalbuterol tartrate) .Marland Kitchen... As needed 2 puffs.  Problem # 4:  ABNORMAL THYROID FUNCTION TESTS (ICD-794.5) PMH of  Complete Medication List: 1)  Singulair 10 Mg Tabs (Montelukast sodium) .... Qd 2)  Xyzal 5 Mg Tabs (Levocetirizine dihydrochloride) .Marland Kitchen.. 1 by mouth once daily as needed 3)  Symbicort 160-4.5 Mcg/act Aero (Budesonide-formoterol fumarate) .... Use one puff twice daily rx'd by allergist. 4)  Xopenex Hfa 45 Mcg/act Aero (Levalbuterol tartrate) .... As needed 2 puffs. 5)  Crestor 20 Mg Tabs (Rosuvastatin calcium) .... Take as directed 6)  Lorazepam 0.5 Mg Tabs (Lorazepam) .Marland Kitchen.. 1 q 8 hrs as needed 7)  Venlafaxine Hcl 75 Mg Tabs (Venlafaxine hcl) .Marland Kitchen.. 1 by mouth once daily  Other Orders: Admin 1st Vaccine (04540) Flu Vaccine 47yrs + (98119)  Lipid Assessment/Plan:  Based on NCEP/ATP III, the patient's risk factor category is "0-1 risk factors".  The patient's lipid goals are as follows: Total cholesterol goal is 200; LDL cholesterol goal is 115; HDL cholesterol goal is 50; Triglyceride goal is 150.  Her LDL cholesterol goal has been met.  Secondary causes for hyperlipidemia have been ruled out.  She has been counseled on adjunctive measures for lowering her cholesterol and has been provided with dietary  instructions.    Patient Instructions: 1)  At a minimum the following fasting labs are needed for optimal care: 2)  SAT, ALT , ICD-9:995.20 3)  Lipid Panel , ICD-9:272.4 4)  TSH, free T4, ICD-9:794.5. Flu Vaccine Consent Questions     Do you have a history of severe allergic reactions to this vaccine? no    Any prior history of allergic reactions to egg and/or gelatin? no    Do you have a sensitivity to the preservative Thimersol? no    Do you have a past history of Guillan-Barre Syndrome? no    Do you currently have an acute febrile illness? no    Have you ever had a severe reaction to latex? no    Vaccine information given and explained to patient? yes    Are you currently pregnant? no    Lot Number:[id number]   Exp Date:05/20/2011   Site Given  Left Deltoid IM  Orders Added: 1)  Admin 1st Vaccine [90471] 2)  Flu Vaccine 40yrs + [90658] 3)  Est. Patient 40-64 years [99396] 4)  EKG w/ Interpretation [93000]    .lbflu

## 2010-12-20 NOTE — Progress Notes (Signed)
Summary: refill new pharmacy  Phone Note Refill Request Message from:  Patient on Apr 14, 2010 2:21 PM  Refills Requested: Medication #1:  EFFEXOR XR 75 MG  CP24 (VENLAFAXINE HCL) 1 by mouth once daily GENERIC OK patient wants refill sent to new pharmacy - [facility] [address]   Initial call taken by: Okey Regal [place],  Apr 14, 2010 2:21 PM    Prescriptions: EFFEXOR XR 75 MG  CP24 (VENLAFAXINE HCL) 1 by mouth once daily GENERIC OK  #90 x 0   Entered by:   Shonna Chock   Authorized by:   Marga Melnick MD   Signed by:   Shonna Chock on 04/14/2010   Method used:   Faxed to ...       [facility] [address] (retail)       [phone]       [place], [place]  [address]       Ph: [phone]       Fax: [phone]   RxID:   [id number]

## 2010-12-20 NOTE — Progress Notes (Signed)
Summary: rx request  Phone Note Refill Request Call back at Home Phone [phone] Message from:  Patient on October 19, 2010 2:54 PM  Refills Requested: Medication #1:  Citalopram   Notes: needs 90 days supply sent to [facility] on [address] Patient notes that she was given an prescription last month. This is not on patient med list, please advise.  Initial call taken by: Lucious Groves CMA,  October 19, 2010 2:54 PM  Follow-up for Phone Call        Per MD-- give 20mg  #90 Sig: 1 by mouth once daily Lucious Groves CMA,  October 19, 2010 4:18 PM   Left message on voicemail notifying patient prescription was faxed. Lucious Groves CMA  October 19, 2010 4:53 PM     New/Updated Medications: CITALOPRAM HYDROBROMIDE 20 MG TABS (CITALOPRAM HYDROBROMIDE) 1 by mouth qd Prescriptions: CITALOPRAM HYDROBROMIDE 20 MG TABS (CITALOPRAM HYDROBROMIDE) 1 by mouth qd  #90 x 0   Entered by:   Lucious Groves CMA   Authorized by:   Marga Melnick MD   Signed by:   Lucious Groves CMA on 10/19/2010   Method used:   Reprint   RxID:   [id number] CITALOPRAM HYDROBROMIDE 20 MG TABS (CITALOPRAM HYDROBROMIDE) 1 by mouth qd  #90 x 0   Entered by:   Lucious Groves CMA   Authorized by:   Marga Melnick MD   Signed by:   Lucious Groves CMA on 10/19/2010   Method used:   Printed then faxed to ...       [facility] [phone]* (retail)       [address]       [place], [place]  [address]       Ph: [phone]       Fax: [phone]   RxID:   [id number]

## 2010-12-22 NOTE — Medication Information (Signed)
Summary: Nonadherence with Citalopram/[facility]  Nonadherence with Citalopram/[facility]   Imported By: Lanelle Bal 11/16/2010 11:08:04  _____________________________________________________________________  External Attachment:    Type:   Image     Comment:   External Document

## 2011-01-13 ENCOUNTER — Telehealth (INDEPENDENT_AMBULATORY_CARE_PROVIDER_SITE_OTHER): Payer: Self-pay | Admitting: *Deleted

## 2011-01-17 NOTE — Progress Notes (Signed)
Summary: refill  Phone Note Refill Request Message from:  Fax from Pharmacy on January 13, 2011 11:23 AM  Refills Requested: Medication #1:  CITALOPRAM HYDROBROMIDE 20 MG TABS 1 by mouth qd. [facility] [address]  90 day supply  Initial call taken by: Okey Regal [place],  January 13, 2011 11:24 AM    Prescriptions: CITALOPRAM HYDROBROMIDE 20 MG TABS (CITALOPRAM HYDROBROMIDE) 1 by mouth qd  #90 x 1   Entered by:   Shonna Chock CMA   Authorized by:   Marga Melnick MD   Signed by:   Shonna Chock CMA on 01/13/2011   Method used:   Electronically to        [facility] [address] (retail)       [phone]       [place], [place]  [address]       Ph: [phone]       Fax: [phone]   RxID:   [id number]

## 2011-07-17 ENCOUNTER — Other Ambulatory Visit: Payer: Self-pay | Admitting: Internal Medicine

## 2011-10-20 ENCOUNTER — Other Ambulatory Visit: Payer: Self-pay

## 2011-10-20 MED ORDER — CITALOPRAM HYDROBROMIDE 20 MG PO TABS: ORAL_TABLET | ORAL | Status: DC

## 2011-10-20 NOTE — Telephone Encounter (Signed)
Patient needs to schedule a CPX  

## 2012-01-15 ENCOUNTER — Other Ambulatory Visit: Payer: Self-pay

## 2012-01-15 MED ORDER — CITALOPRAM HYDROBROMIDE 20 MG PO TABS: ORAL_TABLET | ORAL | Status: DC

## 2012-02-26 ENCOUNTER — Other Ambulatory Visit: Payer: Self-pay | Admitting: *Deleted

## 2012-02-26 MED ORDER — CITALOPRAM HYDROBROMIDE 20 MG PO TABS: ORAL_TABLET | ORAL | Status: DC

## 2012-02-26 NOTE — Telephone Encounter (Signed)
Pending cpx-03-14-12, Rx sent

## 2012-03-14 ENCOUNTER — Encounter: Payer: Self-pay | Admitting: Internal Medicine

## 2012-03-27 ENCOUNTER — Other Ambulatory Visit: Payer: Self-pay

## 2012-03-27 MED ORDER — CITALOPRAM HYDROBROMIDE 20 MG PO TABS: ORAL_TABLET | ORAL | Status: DC

## 2012-03-27 NOTE — Telephone Encounter (Signed)
Pending appointment 04/16/12

## 2012-03-27 NOTE — Telephone Encounter (Signed)
Patient called for a refill on Citalopram, she has not been here in a while. If approved send to [facility].... Please advise     KP

## 2012-04-16 ENCOUNTER — Ambulatory Visit (INDEPENDENT_AMBULATORY_CARE_PROVIDER_SITE_OTHER): Payer: BC Managed Care – PPO | Admitting: Internal Medicine

## 2012-04-16 ENCOUNTER — Encounter: Payer: Self-pay | Admitting: Internal Medicine

## 2012-04-16 VITALS — BP 122/86 | HR 79 | Temp 98.6°F | Resp 12 | Ht 65.75 in | Wt 185.0 lb

## 2012-04-16 DIAGNOSIS — Z0000 Encounter for general adult medical examination without abnormal findings: Secondary | ICD-10-CM

## 2012-04-16 DIAGNOSIS — J45909 Unspecified asthma, uncomplicated: Secondary | ICD-10-CM

## 2012-04-16 DIAGNOSIS — E785 Hyperlipidemia, unspecified: Secondary | ICD-10-CM

## 2012-04-16 LAB — LIPID PANEL
HDL: 74.5 mg/dL (ref >39.00)
LDL Cholesterol: 96 mg/dL (ref 0–99)
Total CHOL/HDL Ratio: 3
Triglycerides: 89 mg/dL (ref 0.0–149.0)
VLDL: 17.8 mg/dL (ref 0.0–40.0)

## 2012-04-16 LAB — CBC WITH DIFFERENTIAL/PLATELET
Eosinophils Absolute: 0.3 10*3/uL (ref 0.0–0.7)
MCHC: 33.4 g/dL (ref 30.0–36.0)
MCV: 96.1 fl (ref 78.0–100.0)
Monocytes Absolute: 0.4 10*3/uL (ref 0.1–1.0)
Neutrophils Relative %: 71.9 % (ref 43.0–77.0)
Platelets: 192 10*3/uL (ref 150.0–400.0)
WBC: 6.2 10*3/uL (ref 4.5–10.5)

## 2012-04-16 LAB — BASIC METABOLIC PANEL
BUN: 11 mg/dL (ref 6–23)
CO2: 26 mEq/L (ref 19–32)
Calcium: 8.7 mg/dL (ref 8.4–10.5)
Chloride: 107 mEq/L (ref 96–112)
Creatinine, Ser: 0.7 mg/dL (ref 0.4–1.2)

## 2012-04-16 LAB — HEPATIC FUNCTION PANEL
Bilirubin, Direct: 0 mg/dL (ref 0.0–0.3)
Total Bilirubin: 0.6 mg/dL (ref 0.3–1.2)

## 2012-04-16 MED ORDER — VENLAFAXINE HCL ER 75 MG PO CP24: 75.0000 mg | ORAL_CAPSULE | Freq: Every day | ORAL | Status: DC

## 2012-04-16 NOTE — Progress Notes (Signed)
Subjective:    Patient ID: Misty [name], female    DOB: [birth year], 46 y.o.   MRN: [id number]  HPI Kristia is here for a physical;acute issues include intermittent headaches over the last 6 months      Review of Systems The headaches are described as throbbing and lasting hours. Typically they will occur at least once a week. Location is variable; the been present over the crown as well as frontally. They do respond to Excedrin. Associated Symptoms Nausea/vomiting:no Photophobia/phonophobia:no  Tearing of eyes:no PMH/Family hx migraine:no Stress triggers: occasionally . She does feel the Effexor is more effective than citalopram. She was on was the latter because of insurance coverage issues Relation to menstrual cycle: ? , not sure. She believes her menses are slightly irregular  Red Flags Fever: no  Neck pain/stiffness: some occasionally Vision/speech/swallow/hearing difficulty: no Focal weakness/numbness: no  Altered mental status: no  New type of headache: more frequent but not new      Objective:   Physical Exam Gen.: Healthy and well-nourished in appearance. Alert, appropriate and cooperative throughout exam. Head: Normocephalic without obvious abnormalities  Eyes: No corneal or conjunctival inflammation noted. Pupils equal round reactive to light and accommodation. Fundal exam is benign without hemorrhages, exudate, papilledema. Extraocular motion intact. Vision  & FOV grossly normal. Ears: External  ear exam reveals no significant lesions or deformities. Canals clear .TMs normal. Hearing is grossly normal bilaterally. Nose: External nasal exam reveals no deformity or inflammation. Nasal mucosa are pink and moist. No lesions or exudates noted.   Mouth: Oral mucosa and oropharynx reveal no lesions or exudates. Teeth in good repair. Neck: No deformities, masses, or tenderness noted. Range of motion normal. Thyroid physiologic asymmetry of the thyroid without  nodularity Lungs: Normal respiratory effort; chest expands symmetrically. Lungs are clear to auscultation without rales, wheezes, or increased work of breathing. Heart: Normal rate and rhythm. Normal S1 and S2. No gallop, click, or rub. S 4 w/o  murmur. Abdomen: Bowel sounds normal; abdomen soft and nontender. No masses, organomegaly or hernias noted. Genitalia: Dr Henderson Cloud, [place] [facility]   .                                                                                   Musculoskeletal/extremities:  No clubbing, cyanosis, edema, or deformity noted. Range of motion  normal .Tone & strength  normal.Joints normal. Nail health  good. Vascular: Carotid, radial artery, dorsalis pedis and  posterior tibial pulses are full and equal. No bruits present. Neurologic: Alert and oriented x3. Deep tendon reflexes symmetrical and normal. Gait, Romberg testing, and finger to nose testing are normal       Skin: Intact without suspicious lesions or rashes. Lymph: No cervical, axillary lymphadenopathy present. Psych: Mood and affect are normal. Normally interactive  Assessment & Plan:  #1 comprehensive physical exam; no acute findings #2 see Problem List with Assessments & Recommendations #3 headache w/o neurologic deficit Plan: see Orders

## 2012-04-16 NOTE — Patient Instructions (Signed)
Preventive Health Care: Exercise  30-45  minutes a day, 3-4 days a week. Walking is especially valuable in preventing Osteoporosis. Eat a low-fat diet with lots of fruits and vegetables, up to 7-9 servings per day. Consume less than 30 grams of sugar per day from foods & drinks with High Fructose Corn Syrup as # 1,2,3 or #4 on label. Health Care Power of Attorney & Living Will place you in charge of your health care  decisions. Verify these are  in place. Please keep a diary of your headaches . Document  each occurrence on the calendar with notation of : #1 any prodrome ( any non headache symptom such as marked fatigue,visual changes, ,etc ) which precedes actual headache ; #2) severity on 1-10 scale; #3) any triggers ( food/ drink,enviromenntal or weather changes ,physical or emotional stress) in 8-12 hour period prior to the headache; & #4) response to any medications or other intervention.  Please review "Headache" @ [facility] for additional information.    Blood Pressure Goal  Ideally is an AVERAGE < 135/85. This AVERAGE should be calculated from @ least 5-7 BP readings taken @ different times of day on different days of week. You should not respond to isolated BP readings , but rather the AVERAGE for that week.  Please try to go on My Chart within the next 24 hours to allow me to release the results directly to you.

## 2012-05-17 ENCOUNTER — Other Ambulatory Visit (INDEPENDENT_AMBULATORY_CARE_PROVIDER_SITE_OTHER): Payer: BC Managed Care – PPO

## 2012-05-17 DIAGNOSIS — D649 Anemia, unspecified: Secondary | ICD-10-CM

## 2012-05-17 LAB — CBC WITH DIFFERENTIAL/PLATELET
Basophils Absolute: 0 10*3/uL (ref 0.0–0.1)
Eosinophils Absolute: 0.3 10*3/uL (ref 0.0–0.7)
Hemoglobin: 12.2 g/dL (ref 12.0–15.0)
Lymphocytes Relative: 33.3 % (ref 12.0–46.0)
MCHC: 34.1 g/dL (ref 30.0–36.0)
Neutro Abs: 2.6 10*3/uL (ref 1.4–7.7)
Neutrophils Relative %: 50.9 % (ref 43.0–77.0)
RDW: 12.9 % (ref 11.5–14.6)

## 2012-05-17 LAB — IBC PANEL: Saturation Ratios: 23.8 % (ref 20.0–50.0)

## 2012-05-17 NOTE — Progress Notes (Signed)
Labs only

## 2012-11-07 ENCOUNTER — Other Ambulatory Visit: Payer: Self-pay | Admitting: Internal Medicine

## 2013-01-04 ENCOUNTER — Other Ambulatory Visit: Payer: Self-pay

## 2013-03-20 LAB — HM PAP SMEAR: HM Pap smear: NORMAL

## 2013-04-05 ENCOUNTER — Other Ambulatory Visit: Payer: Self-pay | Admitting: Internal Medicine

## 2013-04-07 NOTE — Telephone Encounter (Signed)
Schedule CPX 

## 2013-06-29 ENCOUNTER — Other Ambulatory Visit: Payer: Self-pay | Admitting: Internal Medicine

## 2013-09-17 ENCOUNTER — Telehealth: Payer: Self-pay

## 2013-09-17 NOTE — Telephone Encounter (Signed)
Medication and allergies: reviewed and updated  90 day supply/mail order: [facility] Local pharmacy: [facility]   Immunizations due:  UTD  A/P:   Flu vaccine at work FH change; brother heart attack several weeks ago; hx updated No surgical Hx changes PAP--gyn--Dr Huntley Dec [facility]---03/2013; neg per patient Asthma---Dr Sidney Ace  To Discuss with Provider: Not at this time

## 2013-09-19 ENCOUNTER — Ambulatory Visit (INDEPENDENT_AMBULATORY_CARE_PROVIDER_SITE_OTHER): Payer: BC Managed Care – PPO | Admitting: Internal Medicine

## 2013-09-19 ENCOUNTER — Encounter: Payer: Self-pay | Admitting: Internal Medicine

## 2013-09-19 VITALS — BP 133/77 | HR 99 | Temp 98.6°F | Resp 13 | Ht 65.75 in | Wt 198.0 lb

## 2013-09-19 DIAGNOSIS — E785 Hyperlipidemia, unspecified: Secondary | ICD-10-CM

## 2013-09-19 DIAGNOSIS — Z0000 Encounter for general adult medical examination without abnormal findings: Secondary | ICD-10-CM

## 2013-09-19 LAB — CBC WITH DIFFERENTIAL/PLATELET
Basophils Absolute: 0 10*3/uL (ref 0.0–0.1)
Eosinophils Absolute: 0.3 10*3/uL (ref 0.0–0.7)
Lymphocytes Relative: 21 % (ref 12.0–46.0)
MCHC: 34.1 g/dL (ref 30.0–36.0)
MCV: 92.1 fl (ref 78.0–100.0)
Monocytes Absolute: 0.5 10*3/uL (ref 0.1–1.0)
Neutrophils Relative %: 67.2 % (ref 43.0–77.0)
Platelets: 217 10*3/uL (ref 150.0–400.0)
RBC: 4.1 Mil/uL (ref 3.87–5.11)
RDW: 13.2 % (ref 11.5–14.6)

## 2013-09-19 LAB — BASIC METABOLIC PANEL
BUN: 12 mg/dL (ref 6–23)
CO2: 27 mEq/L (ref 19–32)
Calcium: 8.9 mg/dL (ref 8.4–10.5)
Creatinine, Ser: 0.6 mg/dL (ref 0.4–1.2)
Glucose, Bld: 103 mg/dL — ABNORMAL HIGH (ref 70–99)

## 2013-09-19 LAB — LIPID PANEL
HDL: 66.9 mg/dL (ref >39.00)
Triglycerides: 85 mg/dL (ref 0.0–149.0)
VLDL: 17 mg/dL (ref 0.0–40.0)

## 2013-09-19 LAB — TSH: TSH: 0.61 u[IU]/mL (ref 0.35–5.50)

## 2013-09-19 LAB — HEPATIC FUNCTION PANEL
Alkaline Phosphatase: 64 U/L (ref 39–117)
Bilirubin, Direct: 0.1 mg/dL (ref 0.0–0.3)
Total Bilirubin: 0.7 mg/dL (ref 0.3–1.2)

## 2013-09-19 MED ORDER — VENLAFAXINE HCL ER 75 MG PO CP24: ORAL_CAPSULE | ORAL | Status: DC

## 2013-09-19 NOTE — Patient Instructions (Addendum)
Your next office appointment will be determined based upon review of your pending labs. Those instructions will be transmitted to you through My Chart . Please report any significant change in your facial & right arm symptoms. Please take enteric-coated aspirin 81 mg daily with breakfast.

## 2013-09-19 NOTE — Progress Notes (Signed)
  Subjective:    Patient ID: Misty [name], female    DOB: 04/23/1966, 47 y.o.   MRN: [id number]  HPI  She is here for a physical;acute issues include two isolated episodes of facial and right upper extremity tingling while driving this past summer. The episodes lasted approximately 10 minutes. This has not recurred since August.     Review of Systems A heart healthy diet is not followed strictly due to travel;no regular  exercise . Specifically denied are  chest pain, palpitations,  or claudication.  DOE due to weight gain & deconditioning. Family history is strongly positive  for  coronary disease. Advanced cholesterol testing reveals  LDL goal is less than 110 ; ideally < 80  . There is medication non compliance with the statin due to job travel. Significant abdominal symptoms, memory deficit, or myalgias denied.     Objective:   Physical Exam Gen.: Healthy and well-nourished in appearance. Alert, appropriate and cooperative throughout exam. Head: Normocephalic without obvious abnormalities  Eyes: No corneal or conjunctival inflammation noted. Pupils equal round reactive to light and accommodation. FOV normal. Extraocular motion intact. No nystagmus Ears: External  ear exam reveals no significant lesions or deformities. Canals clear .TMs normal. Hearing is grossly normal bilaterally. Nose: External nasal exam reveals no deformity or inflammation. Nasal mucosa are pink and moist. No lesions or exudates noted.   Mouth: Oral mucosa and oropharynx reveal no lesions or exudates. Teeth in good repair. Neck: No deformities, masses, or tenderness noted. Range of motion & Thyroid normal. Lungs: Normal respiratory effort; chest expands symmetrically. Lungs are clear to auscultation without rales, wheezes, or increased work of breathing. Heart: Normal rate and rhythm. Normal S1 and S2. No gallop, click, or rub. No murmur. Abdomen: Bowel sounds normal; abdomen soft and nontender. No masses,  organomegaly or hernias noted. Genitalia: As per Gyn                                  Musculoskeletal/extremities: No deformity or scoliosis noted of  the thoracic or lumbar spine.  No clubbing, cyanosis, edema, or significant extremity  deformity noted. Range of motion normal .Tone & strength  Normal. Joints normal . Nail health good. Able to lie down & sit up w/o help. Negative SLR bilaterally Vascular: Carotid, radial artery, dorsalis pedis and  posterior tibial pulses are full and equal. No bruits present. Neurologic: Alert and oriented x3. Deep tendon reflexes symmetrical and normal.        Skin: Intact without suspicious lesions or rashes. Lymph: No cervical, axillary lymphadenopathy present. Psych: Mood and affect are normal. Normally interactive                                                                                      Assessment & Plan:  #1 comprehensive physical exam; no acute findings #2 isolated episodes of facial and right upper extremity paresthesias in the context of cardiovascular risk factors Plan: see Orders  & Recommendations

## 2013-09-20 ENCOUNTER — Encounter: Payer: Self-pay | Admitting: Internal Medicine

## 2013-09-22 ENCOUNTER — Other Ambulatory Visit: Payer: Self-pay | Admitting: *Deleted

## 2013-09-22 MED ORDER — VENLAFAXINE HCL ER 75 MG PO CP24: ORAL_CAPSULE | ORAL | Status: DC

## 2013-09-22 NOTE — Telephone Encounter (Signed)
Effexor refilled for 90 day supply per patient request.

## 2013-09-24 ENCOUNTER — Ambulatory Visit: Payer: BC Managed Care – PPO

## 2013-09-24 DIAGNOSIS — R7309 Other abnormal glucose: Secondary | ICD-10-CM

## 2013-10-02 ENCOUNTER — Telehealth: Payer: Self-pay | Admitting: *Deleted

## 2013-10-02 NOTE — Telephone Encounter (Signed)
10/02/2013  Pt came by office this afternoon.  She faxed her Biometric Screening Measurements Form Monday 09/29/2013 and had not heard anything.  Angie had the form, we completed the form, stamped it, made a copy and gave completed form to patient.  I sent a copy to batch.  Also attached billing form to a copy and sent back to Angie to find out from [facility] if there is a charge.  bw

## 2013-10-27 ENCOUNTER — Ambulatory Visit (INDEPENDENT_AMBULATORY_CARE_PROVIDER_SITE_OTHER): Payer: BC Managed Care – PPO | Admitting: Family Medicine

## 2013-10-27 ENCOUNTER — Encounter: Payer: Self-pay | Admitting: Family Medicine

## 2013-10-27 VITALS — BP 132/80 | HR 86 | Temp 98.0°F | Wt 200.4 lb

## 2013-10-27 DIAGNOSIS — J019 Acute sinusitis, unspecified: Secondary | ICD-10-CM

## 2013-10-27 MED ORDER — FLUTICASONE PROPIONATE 50 MCG/ACT NA SUSP: 2.0000 | Freq: Every day | NASAL | Status: DC

## 2013-10-27 MED ORDER — CLARITHROMYCIN ER 500 MG PO TB24: 1000.0000 mg | ORAL_TABLET | Freq: Every day | ORAL | Status: AC

## 2013-10-27 NOTE — Progress Notes (Signed)
Pre visit review using our clinic review tool, if applicable. No additional management support is needed unless otherwise documented below in the visit note. 

## 2013-10-27 NOTE — Progress Notes (Signed)
  Subjective:     Misty [name] is a 47 y.o. female who presents for evaluation of sinus pain. Symptoms include: congestion, headaches, nasal congestion, purulent rhinorrhea, sinus pressure and sore throat. Onset of symptoms was 6 days ago. Symptoms have been gradually worsening since that time. Past history is significant for no history of pneumonia or bronchitis. Patient is a non-smoker.  The following portions of the patient's history were reviewed and updated as appropriate: allergies, current medications, past family history, past medical history, past social history, past surgical history and problem list.  Review of Systems Pertinent items are noted in HPI.   Objective:    BP 132/80  Pulse 86  Temp(Src) 98 F (36.7 C) (Oral)  Wt 200 lb 6.4 oz (90.901 kg)  SpO2 96% General appearance: alert, cooperative, appears stated age and no distress Ears: normal TM's and external ear canals both ears Nose: green discharge, moderate congestion, turbinates red, swollen, sinus tenderness bilateral Throat: abnormal findings: mild oropharyngeal erythema and pnd Neck: moderate anterior cervical adenopathy, supple, symmetrical, trachea midline and thyroid not enlarged, symmetric, no tenderness/mass/nodules Lungs: clear to auscultation bilaterally Heart: S1, S2 normal    Assessment:    Acute bacterial sinusitis.    Plan:    Nasal steroids per medication orders. Antihistamines per medication orders. Augmentin per medication orders.

## 2013-10-27 NOTE — Patient Instructions (Signed)

## 2013-12-09 ENCOUNTER — Other Ambulatory Visit: Payer: Self-pay | Admitting: *Deleted

## 2013-12-09 ENCOUNTER — Telehealth: Payer: Self-pay | Admitting: Internal Medicine

## 2013-12-09 MED ORDER — ROSUVASTATIN CALCIUM 5 MG PO TABS: 5.0000 mg | ORAL_TABLET | Freq: Every day | ORAL | Status: DC

## 2013-12-09 NOTE — Telephone Encounter (Signed)
   I would recommend changing the Crestor to 5 mg each day; after 10 weeks @ this dose; I recommend repeat lipids, hepatic panel and CK. Samples can be provided until a 90 day prescription can be obtained mail-order.

## 2013-12-09 NOTE — Telephone Encounter (Signed)
Please advise is patient should continue taking Crestor once weekly.

## 2013-12-09 NOTE — Telephone Encounter (Signed)
Called and spoke with patient. She is going to pick up crestor samples on Friday. 90 day supply of Crestor 5 mg was sent to mail order pharmacy. JG//CMA

## 2013-12-09 NOTE — Telephone Encounter (Signed)
Patient is calling to follow-up on lab results from her physical on 09/19/2013. She states that she was supposed to get a call to let her know whether or not she still needed to continue her current dosage of Crestor or not.   She is almost out of the Crestor and needs samples until we can refill it to her mail order pharmacy and they be delivered. [facility] and is requesting a 90-day supply. If samples are not available she uses [facility]. Please advise.

## 2014-05-16 ENCOUNTER — Other Ambulatory Visit: Payer: Self-pay | Admitting: Internal Medicine

## 2014-06-08 ENCOUNTER — Telehealth: Payer: Self-pay | Admitting: Internal Medicine

## 2014-06-08 MED ORDER — ROSUVASTATIN CALCIUM 5 MG PO TABS: 5.0000 mg | ORAL_TABLET | Freq: Every day | ORAL | Status: DC

## 2014-06-08 NOTE — Telephone Encounter (Signed)
Done . rx sent  

## 2014-06-08 NOTE — Telephone Encounter (Signed)
Caller name: Relation to pt: Call back number:[phone] Pharmacy: [facility]   Reason for call: Pt is transferring from Dr. Linna Darner and is needing a generic version of Rx CRESTOR 5 MG tablet.  Can we get this sent to the mail order pharmacy.

## 2014-06-24 ENCOUNTER — Telehealth: Payer: Self-pay | Admitting: *Deleted

## 2014-06-24 NOTE — Telephone Encounter (Signed)
Caller name: Jacara Relation to pt: self Call back number: [phone] Pharmacy:  Reason for call: Pt called 06/08/2014 to request generic Rx for rosuvastatin (CRESTOR) 5 MG tablet.  Her mail order company does not have the generic and she request the generic Rx for 2 months be sent to [facility] on [address].  From there, pt wants to know if there is another medicine similar to Crestor that has a generic that she can see if her Mail Order Pharmacy has.  She has appt 08/25/2014 with Larose Kells and will discuss prescription at that appt.

## 2014-06-26 NOTE — Telephone Encounter (Signed)
There is no generic and I saw her for a sinus infection

## 2014-06-29 NOTE — Telephone Encounter (Signed)
lmovm

## 2014-06-29 NOTE — Telephone Encounter (Signed)
The patient is Establishing with Dr.Paz. Forwarded to Shanon Brow and Dr.Paz.    Misty [name]

## 2014-06-29 NOTE — Telephone Encounter (Signed)
Please be sure the patient has a followup within few weeks with me. There is no generic for Crestor however he may like to try a generic for another chol med such as Lipitor 20 mg one by mouth each bedtime #30 and one refill.

## 2014-06-30 MED ORDER — ATORVASTATIN CALCIUM 10 MG PO TABS: 10.0000 mg | ORAL_TABLET | Freq: Every day | ORAL | Status: DC

## 2014-06-30 NOTE — Telephone Encounter (Signed)
Lipitor 10 mg one by mouth daily #30 and one refill

## 2014-06-30 NOTE — Telephone Encounter (Signed)
Patient notified. Rx sent to [facility] on [address] [place]

## 2014-06-30 NOTE — Telephone Encounter (Signed)
Pt states that she would like to try the lipitor but a smaller dosage then 20mg  until her next appointment. Please advise

## 2014-07-15 ENCOUNTER — Other Ambulatory Visit: Payer: Self-pay

## 2014-07-15 NOTE — Telephone Encounter (Signed)
Opened in error

## 2014-07-30 ENCOUNTER — Emergency Department (HOSPITAL_BASED_OUTPATIENT_CLINIC_OR_DEPARTMENT_OTHER)
Admission: EM | Admit: 2014-07-30 | Discharge: 2014-07-31 | Disposition: A | Discharge status: Home / Self Care | Payer: BC Managed Care – PPO | Attending: Emergency Medicine | Admitting: Emergency Medicine

## 2014-07-30 ENCOUNTER — Encounter (HOSPITAL_BASED_OUTPATIENT_CLINIC_OR_DEPARTMENT_OTHER): Payer: Self-pay | Admitting: Emergency Medicine

## 2014-07-30 ENCOUNTER — Telehealth: Payer: Self-pay

## 2014-07-30 DIAGNOSIS — J45909 Unspecified asthma, uncomplicated: Secondary | ICD-10-CM | POA: Diagnosis not present

## 2014-07-30 DIAGNOSIS — F3289 Other specified depressive episodes: Secondary | ICD-10-CM | POA: Insufficient documentation

## 2014-07-30 DIAGNOSIS — F329 Major depressive disorder, single episode, unspecified: Secondary | ICD-10-CM | POA: Diagnosis not present

## 2014-07-30 DIAGNOSIS — R1013 Epigastric pain: Secondary | ICD-10-CM | POA: Diagnosis present

## 2014-07-30 DIAGNOSIS — K920 Hematemesis: Secondary | ICD-10-CM | POA: Diagnosis not present

## 2014-07-30 DIAGNOSIS — E785 Hyperlipidemia, unspecified: Secondary | ICD-10-CM | POA: Insufficient documentation

## 2014-07-30 DIAGNOSIS — Z880 Allergy status to penicillin: Secondary | ICD-10-CM | POA: Insufficient documentation

## 2014-07-30 DIAGNOSIS — F411 Generalized anxiety disorder: Secondary | ICD-10-CM | POA: Diagnosis not present

## 2014-07-30 DIAGNOSIS — Z79899 Other long term (current) drug therapy: Secondary | ICD-10-CM | POA: Diagnosis not present

## 2014-07-30 DIAGNOSIS — K219 Gastro-esophageal reflux disease without esophagitis: Secondary | ICD-10-CM | POA: Insufficient documentation

## 2014-07-30 LAB — CBC
HCT: 36.7 % (ref 36.0–46.0)
Hemoglobin: 12.4 g/dL (ref 12.0–15.0)
MCH: 31.6 pg (ref 26.0–34.0)
MCHC: 33.8 g/dL (ref 30.0–36.0)
MCV: 93.4 fL (ref 78.0–100.0)
PLATELETS: 252 10*3/uL (ref 150–400)
RBC: 3.93 MIL/uL (ref 3.87–5.11)
RDW: 13.1 % (ref 11.5–15.5)
WBC: 12.7 10*3/uL — AB (ref 4.0–10.5)

## 2014-07-30 LAB — COMPREHENSIVE METABOLIC PANEL
ALBUMIN: 3.8 g/dL (ref 3.5–5.2)
ALT: 9 U/L (ref 0–35)
AST: 11 U/L (ref 0–37)
Alkaline Phosphatase: 63 U/L (ref 39–117)
Anion gap: 11 (ref 5–15)
BUN: 10 mg/dL (ref 6–23)
CHLORIDE: 102 meq/L (ref 96–112)
CO2: 25 mEq/L (ref 19–32)
Calcium: 9.4 mg/dL (ref 8.4–10.5)
Creatinine, Ser: 0.7 mg/dL (ref 0.50–1.10)
GFR calc Af Amer: >90 mL/min (ref >90)
GFR calc non Af Amer: >90 mL/min (ref >90)
Glucose, Bld: 104 mg/dL — ABNORMAL HIGH (ref 70–99)
POTASSIUM: 3.7 meq/L (ref 3.7–5.3)
SODIUM: 138 meq/L (ref 137–147)
TOTAL PROTEIN: 7.2 g/dL (ref 6.0–8.3)
Total Bilirubin: 0.7 mg/dL (ref 0.3–1.2)

## 2014-07-30 MED ORDER — SODIUM CHLORIDE 0.9 % IV SOLN: 80.0000 mg | Freq: Once | INTRAVENOUS | Status: DC

## 2014-07-30 MED ORDER — ONDANSETRON HCL 4 MG/2ML IJ SOLN
4.0000 mg | Freq: Once | INTRAMUSCULAR | Status: AC
  Administered 2014-07-30: 4 mg via INTRAVENOUS
  Filled 2014-07-30: qty 2

## 2014-07-30 MED ORDER — SUCRALFATE 1 G PO TABS: 1.0000 g | ORAL_TABLET | Freq: Three times a day (TID) | ORAL | Status: DC

## 2014-07-30 MED ORDER — HYDROCODONE-ACETAMINOPHEN 5-325 MG PO TABS
2.0000 | ORAL_TABLET | Freq: Once | ORAL | Status: AC
  Administered 2014-07-30: 2 via ORAL
  Filled 2014-07-30: qty 2

## 2014-07-30 MED ORDER — HYDROCODONE-ACETAMINOPHEN 5-325 MG PO TABS: 1.0000 | ORAL_TABLET | Freq: Four times a day (QID) | ORAL | Status: DC | PRN

## 2014-07-30 MED ORDER — PANTOPRAZOLE SODIUM 40 MG IV SOLR
INTRAVENOUS | Status: AC
Start: 2014-07-30 — End: 2014-07-30
  Administered 2014-07-30: 80 mg via INTRAVENOUS
  Filled 2014-07-30: qty 80

## 2014-07-30 MED ORDER — GI COCKTAIL ~~LOC~~
30.0000 mL | Freq: Once | ORAL | Status: AC
  Administered 2014-07-30: 30 mL via ORAL
  Filled 2014-07-30: qty 30

## 2014-07-30 MED ORDER — ONDANSETRON 4 MG PO TBDP: ORAL_TABLET | ORAL | Status: DC

## 2014-07-30 MED ORDER — SODIUM CHLORIDE 0.9 % IV BOLUS (SEPSIS)
1000.0000 mL | Freq: Once | INTRAVENOUS | Status: AC
  Administered 2014-07-30: 1000 mL via INTRAVENOUS

## 2014-07-30 MED ORDER — SUCRALFATE 1 G PO TABS
1.0000 g | ORAL_TABLET | Freq: Once | ORAL | Status: AC
  Administered 2014-07-30: 1 g via ORAL
  Filled 2014-07-30: qty 1

## 2014-07-30 MED ORDER — PANTOPRAZOLE SODIUM 20 MG PO TBEC: 20.0000 mg | DELAYED_RELEASE_TABLET | Freq: Two times a day (BID) | ORAL | Status: DC

## 2014-07-30 NOTE — Discharge Instructions (Signed)
Hematemesis °This condition is the vomiting of blood. °CAUSES  °This can happen if you have a peptic ulcer or an irritation of the throat, stomach, or small bowel. Vomiting over and over again or swallowing blood from a nosebleed, coughing or facial injury can also result in bloody vomit. Anti-inflammatory pain medicines are a common cause of this potentially dangerous condition. The most serious causes of vomiting blood include: °· Ulcers (a bacteria called H. pylori is common cause of ulcers). °· Clotting problems. °· Alcoholism. °· Cirrhosis. °TREATMENT  °Treatment depends on the cause and the severity of the bleeding. Small amounts of blood streaks in the vomit is not the same as vomiting large amounts of bloody or dark, coffee grounds-like material. Weakness, fainting, dehydration, anemia, and continued alcohol or drug use increase the risk. Examination may include blood, vomit, or stool tests. The presence of bloody or dark stool that tests positive for blood (Hemoccult) means the bleeding has been going on for some time. Endoscopy and imaging studies may be done. Emergency treatment may include: °· IV medicines or fluids. °· Blood transfusions. °· Surgery. °Hospital care is required for high risk patients or when IV fluids or blood is needed. Upper GI bleeding can cause shock and death if not controlled. °HOME CARE INSTRUCTIONS  °· Your treatment does not require hospital care at this time. °· Remain at rest until your condition improves. °· Drink clear liquids as tolerated. °· Avoid: °¨ Alcohol. °¨ Nicotine. °¨ Aspirin. °¨ Any other anti-inflammatory medicine (ibuprofen, naproxen, and many others). °· Medications to suppress stomach acid or vomiting may be needed. Take all your medicine as prescribed. °· Be sure to see your caregiver for follow-up as recommended. °SEEK IMMEDIATE MEDICAL CARE IF:  °· You have repeated vomiting, dehydration, fainting, or extreme weakness. °· You are vomiting large amounts of  bloody or dark material. °· You pass large, dark or bloody stools. °Document Released: 12/14/2004 Document Revised: 01/29/2012 Document Reviewed: 12/30/2008 °ExitCare® Patient Information ©2015 [employer]. This information is not intended to replace advice given to you by your health care provider. Make sure you discuss any questions you have with your health care provider. ° °

## 2014-07-30 NOTE — ED Notes (Signed)
Epigastric pain this am. Diaphoretic. Vomited bright red blood. Vomit with black returns around 11am. Took her friends Dexalant. She is nauseated and can still feel a little epigastric pain.

## 2014-07-30 NOTE — Telephone Encounter (Signed)
Spoke with patient and she said she ate sushi last night for dinner but woke up today and  is having nausea. She said she ate a piece of croissant and had a cup of apple juice, she said she developed a shooting pain in her chest and she then had to vomit, she said she vomited blood about 1/2 of a cup of blood, she said it feels like something is stuck in her chest, about an hour later she vomited blood again but it was darker than the previous incident. She is not in distress, she is not having a fever, no SOB and no RN or CAN was available. I advised the patient it would be best to consider the ED as they can do the necessary imaging to see why she is having the chest pain. She said she is a short distance from the facility and is willing to do so. I made her aware I would let Dr.Paz know since she has a new patient apt coming up soon. She voiced understanding and will proceed to the [facility] for evaluation.       KP

## 2014-07-30 NOTE — ED Notes (Signed)
Has had no vomiting since arrival to ed

## 2014-07-30 NOTE — ED Provider Notes (Signed)
CSN: [id number]     Arrival date & time 07/30/14  1817 History   First MD Initiated Contact with Patient 07/30/14 1920     This chart was scribed for Evelina Bucy, MD by Forrestine Him, ED Scribe. This patient was seen in room MH02/MH02 and the patient's care was started 7:29 PM.   Chief Complaint  Patient presents with  . Abdominal Pain   Patient is a 48 y.o. female presenting with abdominal pain. The history is provided by the patient. No language interpreter was used.  Abdominal Pain Pain location:  Epigastric Pain radiates to:  Does not radiate Pain severity:  Moderate Onset quality:  Sudden Timing:  Constant Chronicity:  New Context: eating   Context: not alcohol use and not recent travel   Relieved by:  Nothing Worsened by:  Nothing tried Associated symptoms: nausea and vomiting   Associated symptoms: no chest pain, no diarrhea, no hematochezia and no shortness of breath   Nausea:    Severity:  Moderate   Onset quality:  Sudden   Timing:  Constant   Progression:  Unchanged Vomiting:    Quality:  Bright red blood   Number of occurrences:  3   Severity:  Moderate Risk factors: no alcohol abuse     HPI Comments: Misty [name] is a 48 y.o. female who presents to the Emergency Department complaining of intermittent, moderate epigastric abdominal pain onset this morning.  She describes discomfort as a "knot". She reports 2 episodes of vomiting consisting of pure blood and 1 episode of black colored vomiting along with nausea. Last episode around 11 AM. Pt states she noted discomfort and associated symptoms after eating a bite of a croissant and a sip of orange juice.  Pt states she took a dose of her friends Dexalant with mild improvement for symptoms. No SOB, CP, anal bleeding, diarrhea, or dizziness. No syncope. She admits to drinking a glass of wine once a week. No history of liver problems. Pt with known allergy to Penicillin.  She is followed by Dr. Kathlene November  Past Medical  History  Diagnosis Date  . Anxiety state, unspecified   . Asthma   . Depressive disorder   . Other and unspecified hyperlipidemia   . Lactose intolerance   . Osteoarthritis     Dr.Collins   . Nonspecific abnormal results of thyroid function study    Past Surgical History  Procedure Laterality Date  . Tonsillectomy and adenoidectomy    . Ganglion cyst excision      Right Wrist   . Bilateral knee arthroscopy      Left: 11/10/2008, Right: 04/15/2009. Dr.Andy Collins  . G 1 p 1     Family History  Problem Relation Age of Onset  . COPD Mother     smoker  . Heart disease Mother     stent @ 25; ablation for tachycardia  . Heart attack Father 48  . Lung disease Father     mesothelioma  . Hypertension Father   . Hyperlipidemia Sister   . Diabetes Maternal Aunt   . Lung cancer Maternal Grandmother   . Stroke Maternal Grandfather   . Breast cancer Paternal Grandmother   . Osteopenia Sister   . Heart attack Brother 69   History  Substance Use Topics  . Smoking status: Never Smoker   . Smokeless tobacco: Not on file  . Alcohol Use: 1.2 oz/week    2 Glasses of wine per week     Comment: Socially  OB History   Grav Para Term Preterm Abortions TAB SAB Ect Mult Living                 Review of Systems  Respiratory: Negative for shortness of breath.   Cardiovascular: Negative for chest pain.  Gastrointestinal: Positive for nausea, vomiting and abdominal pain. Negative for diarrhea, blood in stool, hematochezia, anal bleeding and rectal pain.  All other systems reviewed and are negative.     Allergies  Penicillins  Home Medications   Prior to Admission medications   Medication Sig Start Date End Date Taking? Authorizing Provider  albuterol (PROAIR HFA) 108 (90 BASE) MCG/ACT inhaler Inhale into the lungs every 6 (six) hours as needed for wheezing or shortness of breath.    Historical Provider, MD  atorvastatin (LIPITOR) 10 MG tablet Take 1 tablet (10 mg total) by mouth  daily. 06/30/14   Colon Branch, MD  budesonide-formoterol [facility]) 80-4.5 MCG/ACT inhaler Inhale 2 puffs into the lungs 2 (two) times daily.    Historical Provider, MD  Desloratadine (CLARINEX PO) Take by mouth as needed.    Historical Provider, MD  fluticasone (FLONASE) 50 MCG/ACT nasal spray Place 2 sprays into both nostrils daily. 10/27/13   Alferd Apa Lowne, DO  montelukast (SINGULAIR) 10 MG tablet Take 10 mg by mouth at bedtime.    Historical Provider, MD  rosuvastatin (CRESTOR) 5 MG tablet Take 1 tablet (5 mg total) by mouth daily. 06/08/14   Colon Branch, MD  venlafaxine XR (EFFEXOR-XR) 75 MG 24 hr capsule 1 BY MOUTH DAILY 09/22/13   Hendricks Limes, MD   Triage Vitals: BP 155/87  Pulse 102  Temp(Src) 98.1 F (36.7 C) (Oral)  Resp 20  Ht 5' 5.5" (1.664 m)  Wt 200 lb (90.719 kg)  BMI 32.76 kg/m2  SpO2 97%  LMP 07/24/2014   Physical Exam  Nursing note and vitals reviewed. Constitutional: She is oriented to person, place, and time. She appears well-developed and well-nourished. No distress.  HENT:  Head: Normocephalic and atraumatic.  Mouth/Throat: Oropharynx is clear and moist.  Eyes: EOM are normal. Pupils are equal, round, and reactive to light.  Neck: Normal range of motion. Neck supple.  Cardiovascular: Normal rate and regular rhythm.  Exam reveals no friction rub.   No murmur heard. Pulmonary/Chest: Effort normal and breath sounds normal. No respiratory distress. She has no wheezes. She has no rales.  Abdominal: Soft. She exhibits no distension. There is tenderness (mild epigastric). There is no rebound.  Musculoskeletal: Normal range of motion. She exhibits no edema.  Neurological: She is alert and oriented to person, place, and time.  Skin: She is not diaphoretic.    ED Course  Procedures (including critical care time)  DIAGNOSTIC STUDIES: Oxygen Saturation is 97% on RA, Normal by my interpretation.    COORDINATION OF CARE: 7:55 PM- Will order EKG, CMP, and CBC. Will  give fluids, Protonix, and Zofran. Discussed treatment plan with pt at bedside and pt agreed to plan.     Labs Review Labs Reviewed  CBC - Abnormal; Notable for the following:    WBC 12.7 (*)    All other components within normal limits  COMPREHENSIVE METABOLIC PANEL - Abnormal; Notable for the following:    Glucose, Bld 104 (*)    All other components within normal limits    Imaging Review No results found.   EKG Interpretation None      MDM   Final diagnoses:  Hematemesis with nausea  48 year old female presents with hematemesis. Had episode of bright red hematemesis this morning, 2 hours later had coffee-ground emesis. No history of peptic ulcers, GI bleed, gastritis. Is a heavy drinker, no liver disease. Exam is benign except for mild epigastric tenderness here. No other abdominal tenderness, rebound or guarding. Vitals stable. Denies any dizziness, lightheadedness, chest pain, shortness of breath. Given IV PPI and GI cocktail. Patient symptoms are improving, labs are normal. I spoke with Dr. Deatra Ina with GI who states she could be seen as an outpatient since she is young and healthy, labs are normal, no dizziness. Patient given strict return precautions, Carafate, PPI, pain medicine. Stable for discharge.  I personally performed the services described in this documentation, which was scribed in my presence. The recorded information has been reviewed and is accurate.    Evelina Bucy, MD 07/30/14 [phone]

## 2014-07-30 NOTE — ED Notes (Signed)
States still having pain but is not as sharp

## 2014-07-31 ENCOUNTER — Ambulatory Visit (INDEPENDENT_AMBULATORY_CARE_PROVIDER_SITE_OTHER): Payer: BC Managed Care – PPO | Admitting: Nurse Practitioner

## 2014-07-31 ENCOUNTER — Telehealth: Payer: Self-pay | Admitting: *Deleted

## 2014-07-31 ENCOUNTER — Encounter: Payer: Self-pay | Admitting: Nurse Practitioner

## 2014-07-31 VITALS — BP 126/82 | HR 88 | Ht 65.25 in | Wt 211.4 lb

## 2014-07-31 DIAGNOSIS — R1319 Other dysphagia: Secondary | ICD-10-CM

## 2014-07-31 DIAGNOSIS — R1310 Dysphagia, unspecified: Secondary | ICD-10-CM

## 2014-07-31 DIAGNOSIS — R0789 Other chest pain: Secondary | ICD-10-CM

## 2014-07-31 DIAGNOSIS — K219 Gastro-esophageal reflux disease without esophagitis: Secondary | ICD-10-CM

## 2014-07-31 DIAGNOSIS — R079 Chest pain, unspecified: Secondary | ICD-10-CM

## 2014-07-31 DIAGNOSIS — K920 Hematemesis: Secondary | ICD-10-CM

## 2014-07-31 DIAGNOSIS — R110 Nausea: Secondary | ICD-10-CM

## 2014-07-31 NOTE — Telephone Encounter (Signed)
We can see her anytime she needs [place] however it seems to be a straightforward GI issue. Please call GI and see if she can be sooner. Advise patient to call this office anytime if she has more symptoms

## 2014-07-31 NOTE — Telephone Encounter (Signed)
Spoke with patient and she reports she is not vomiting now but continues to have chest pain that feels like something is stuck. Scheduled with Tye Savoy, NP today at 1:30 PM. Patient aware.

## 2014-07-31 NOTE — Telephone Encounter (Signed)
Per Dr Deatra Ina, patient needs to be seen ASAP.

## 2014-07-31 NOTE — Telephone Encounter (Signed)
Thanks, please check on her today

## 2014-07-31 NOTE — Telephone Encounter (Signed)
Please advise if I should call Dr. Kelby Fam office and see if she can be seen sooner or if we should have her come her for an appt with you or Percell Miller or [place].

## 2014-07-31 NOTE — Telephone Encounter (Signed)
Patient calling regarding this. She states that the ED physician told her that she would need to see Dr. Deatra Ina. That office told her that she could be seen on 09/07/14. She wants to know if our office could call Dr. Guillermina [place] office to see if she could be seen sooner.  Best # [phone]

## 2014-07-31 NOTE — Telephone Encounter (Signed)
Pt is currently at [facility] being seen by Dr. Deatra Ina.

## 2014-07-31 NOTE — Patient Instructions (Signed)

## 2014-08-03 ENCOUNTER — Encounter: Payer: Self-pay | Admitting: Nurse Practitioner

## 2014-08-03 ENCOUNTER — Ambulatory Visit (AMBULATORY_SURGERY_CENTER): Payer: BC Managed Care – PPO | Admitting: Internal Medicine

## 2014-08-03 ENCOUNTER — Encounter: Payer: Self-pay | Admitting: Internal Medicine

## 2014-08-03 VITALS — BP 130/80 | HR 84 | Temp 99.1°F | Resp 16 | Ht 65.25 in | Wt 211.0 lb

## 2014-08-03 DIAGNOSIS — R079 Chest pain, unspecified: Secondary | ICD-10-CM | POA: Insufficient documentation

## 2014-08-03 DIAGNOSIS — K920 Hematemesis: Secondary | ICD-10-CM

## 2014-08-03 DIAGNOSIS — K2210 Ulcer of esophagus without bleeding: Secondary | ICD-10-CM | POA: Insufficient documentation

## 2014-08-03 DIAGNOSIS — R1319 Other dysphagia: Secondary | ICD-10-CM

## 2014-08-03 DIAGNOSIS — R1310 Dysphagia, unspecified: Secondary | ICD-10-CM

## 2014-08-03 MED ORDER — SUCRALFATE 1 GM/10ML PO SUSP: ORAL | Status: DC

## 2014-08-03 MED ORDER — SODIUM CHLORIDE 0.9 % IV SOLN: 500.0000 mL | INTRAVENOUS | Status: DC

## 2014-08-03 MED ORDER — PANTOPRAZOLE SODIUM 40 MG PO TBEC: DELAYED_RELEASE_TABLET | ORAL | Status: DC

## 2014-08-03 NOTE — Progress Notes (Addendum)
HPI :  Patient is a 48 year old female, knew to this practice, here for evaluation of chest pain and hematemesis. She was evaluated in ED on 07/30/14. Chest pain started abruptly after eating breakfast. Patient later vomited dark material containing some bright red blood. At this point she went to ED where WBC was 12.7 but CBC o/w normal. LFTs were normal. EKG revealed sinus tachycardia (HR 102). No xrays done. Patient was given a GI cocktail and prescription for carafate and PPI.  She continues to have mid chest pain, especially when eating. No associated SOB. No further vomiting. No melena.  No recent antibiotics. She uses steroid inhalers but rinses mouth after each use.   Patient gives a history of chronic intermittent solid food dysphagia. She has occasional "indigestion" described as epigastric burning. No other GI complaints.    Past Medical History  Diagnosis Date  . Anxiety state, unspecified   . Asthma   . Depressive disorder   . Other and unspecified hyperlipidemia   . Lactose intolerance   . Osteoarthritis     Dr.Collins   . Nonspecific abnormal results of thyroid function study   . Allergy     seasonal  . GERD (gastroesophageal reflux disease)     Family History  Problem Relation Age of Onset  . COPD Mother     smoker  . Heart disease Mother     stent @ 57; ablation for tachycardia  . Heart attack Father 70  . Lung disease Father     mesothelioma  . Hypertension Father   . Hyperlipidemia Sister   . Diabetes Maternal Aunt   . Lung cancer Maternal Grandmother   . Stroke Maternal Grandfather   . Breast cancer Paternal Grandmother   . Osteopenia Sister   . Heart attack Brother 45  . Esophageal cancer Neg Hx   . Stomach cancer Neg Hx    History  Substance Use Topics  . Smoking status: Never Smoker   . Smokeless tobacco: Never Used  . Alcohol Use: 1.2 oz/week    2 Glasses of wine per week     Comment: Socially   Current Outpatient Prescriptions    Medication Sig Dispense Refill  . albuterol (PROAIR HFA) 108 (90 BASE) MCG/ACT inhaler Inhale into the lungs every 6 (six) hours as needed for wheezing or shortness of breath.      Marland Kitchen atorvastatin (LIPITOR) 10 MG tablet Take 1 tablet (10 mg total) by mouth daily.  30 tablet  1  . budesonide-formoterol (SYMBICORT) 80-4.5 MCG/ACT inhaler Inhale 2 puffs into the lungs 2 (two) times daily.      . Desloratadine (CLARINEX PO) Take by mouth as needed.      Marland Kitchen HYDROcodone-acetaminophen (NORCO/VICODIN) 5-325 MG per tablet Take 1 tablet by mouth every 6 (six) hours as needed for moderate pain.  20 tablet  0  . montelukast (SINGULAIR) 10 MG tablet Take 10 mg by mouth at bedtime.      . ondansetron (ZOFRAN ODT) 4 MG disintegrating tablet 4mg  ODT q6 hours prn nausea/vomit  10 tablet  0  . venlafaxine XR (EFFEXOR-XR) 75 MG 24 hr capsule 1 BY MOUTH DAILY  90 capsule  3  . BIOTIN PO Take by mouth.      . Calcium-Magnesium-Vitamin D (CALCIUM MAGNESIUM PO) Take by mouth.      Marland Kitchen GLUCOSAMINE SULFATE PO Take by mouth.      . Omega-3 Fatty Acids (FISH OIL PO) Take by mouth.      Marland Kitchen  pantoprazole (PROTONIX) 40 MG tablet Take 1 tablet 30 minutes before your breakfast and 30 minutes before your supper for 8 weeks  60 tablet  3  . sucralfate (CARAFATE) 1 GM/10ML suspension Take 1 dose (10 mL) before each meal and at bedtime  420 mL  1   No current facility-administered medications for this visit.    Allergies  Allergen Reactions  . Penicillins     Unspecified reaction as an infant   Review of Systems: All systems reviewed and negative except where noted in HPI.    Physical Exam: BP 126/82  Pulse 88  Ht 5' 5.25" (1.657 m)  Wt 211 lb 6 oz (95.879 kg)  BMI 34.92 kg/m2  LMP 07/24/2014 Constitutional: Pleasant,well-developed, white female in no acute distress. HEENT: Normocephalic and atraumatic. Conjunctivae are normal. No scleral icterus. No oral candida seen Neck supple.  Cardiovascular: Normal rate, regular  rhythm.  Pulmonary/chest: Effort normal and breath sounds normal. No wheezing, rales or rhonchi. Abdominal: Soft, nondistended, nontender. Bowel sounds active throughout. There are no masses palpable. No hepatomegaly. Extremities: no edema Lymphadenopathy: No cervical adenopathy noted. Neurological: Alert and oriented to person place and time. Skin: Skin is warm and dry. No rashes noted. Psychiatric: Normal mood and affect. Behavior is normal.   ASSESSMENT AND PLAN:  48. 48 year old female with acute chest pain / odynophagia / and what sounds like an episode of hematemesis. Rule out pill esophagitis, severe erosive esophagitis. For further evaluation patient will be scheduled for EGD. The benefits, risks, and potential complications of EGD with possible biopsies and/or dilation were discussed with the patient and she agrees to proceed. Continue PPI as prescribed by ED.   2. Chronic intermittent dysphagia. Rule out peptic stricture. No unexplained weight loss, dysphagia non-progressive. Further evaluation at time of EGD  3. Asthma, on inhalers and singular.   Addendum: Reviewed and agree with initial management. Jerene Bears, MD

## 2014-08-03 NOTE — Progress Notes (Signed)
Report to PACU, RN, vss, BBS= Clear.  

## 2014-08-03 NOTE — Op Note (Signed)
[place]  [address] [place], [address]   ENDOSCOPY PROCEDURE REPORT  PATIENT: [name], Misty  MR#: [id number] BIRTHDATE: 09-Apr-1966 , 53  yrs. old GENDER: Female ENDOSCOPIST: Jerene Bears, MD PROCEDURE DATE:  08/03/2014 PROCEDURE:  EGD, diagnostic ASA CLASS:     Class II INDICATIONS:  chest pain, dysphagia, odynophagia, hematemesis. MEDICATIONS: MAC sedation, administered by CRNA and propofol (Diprivan) 200mg  IV TOPICAL ANESTHETIC: none  DESCRIPTION OF PROCEDURE: After the risks benefits and alternatives of the procedure were thoroughly explained, informed consent was obtained.  The LB [id number] endoscope was introduced through the mouth and advanced to the second portion of the duodenum. Without limitations.  The instrument was slowly withdrawn as the mucosa was fully examined.     ESOPHAGUS: A single non-bleeding linear ulcer versus mucosal tear was found 27-35 cm from the incisors.  This was moderately deep, particularly in the proximal portion.   A 2 cm hiatal hernia was noted, though the Z line was regular. The end of the ulceration was approximately 2 cm above the Z line.  Question of non-obstructing Schatzki's ring.  STOMACH: The mucosa of the stomach appeared normal.  DUODENUM: The duodenal mucosa showed no abnormalities in the bulb and second portion of the duodenum.  Retroflexed views revealed no abnormalities.     The scope was then withdrawn from the patient and the procedure completed.  COMPLICATIONS: There were no complications.   ENDOSCOPIC IMPRESSION: 1.   Single non-bleeding linear ulcer was found 27-35 cm from the incisors 2.   2 cm hiatal hernia 3.   The mucosa of the stomach appeared normal 4.   The duodenal mucosa showed no abnormalities in the bulb and second portion of the duodenum  RECOMMENDATIONS: 1.  Twice daily pantoprazole 40 mg for 8 weeks, sucralfate liquid 1 g before meals and at bedtime 2.   Repeat EGD in 8-12 weeks to document healing, recommend esophageal biopsies at that time to exclude eosinophilic esophagitis 3.  Avoid NSAIDs when possible   eSigned:  Jerene Bears, MD 08/03/2014 11:15 AM   CC:The Patient, Kathlene November, MD, and Tye Savoy ACNP-BC  PATIENT NAME:  Misty, [name] MR#: [id number]

## 2014-08-03 NOTE — Patient Instructions (Signed)
YOU HAD AN ENDOSCOPIC PROCEDURE TODAY AT [facility]: Refer to the procedure report that was given to you for any specific questions about what was found during the examination.  If the procedure report does not answer your questions, please call your gastroenterologist to clarify.  If you requested that your care partner not be given the details of your procedure findings, then the procedure report has been included in a sealed envelope for you to review at your convenience later.  YOU SHOULD EXPECT: Some feelings of bloating in the abdomen. Passage of more gas than usual.  Walking can help get rid of the air that was put into your GI tract during the procedure and reduce the bloating.  DIET: Your first meal following the procedure should be a light meal and then it is ok to progress to your normal diet.  A half-sandwich or bowl of soup is an example of a good first meal.  Heavy or fried foods are harder to digest and may make you feel nauseous or bloated.  Likewise meals heavy in dairy and vegetables can cause extra gas to form and this can also increase the bloating.  Drink plenty of fluids but you should avoid alcoholic beverages for 24 hours.  ACTIVITY: Your care partner should take you home directly after the procedure.  You should plan to take it easy, moving slowly for the rest of the day.  You can resume normal activity the day after the procedure however you should NOT DRIVE or use heavy machinery for 24 hours (because of the sedation medicines used during the test).    SYMPTOMS TO REPORT IMMEDIATELY: A gastroenterologist can be reached at any hour.  During normal business hours, 8:30 AM to 5:00 PM Monday through Friday, call [phone].  After hours and on weekends, please call the GI answering service at [phone] who will take a message and have the physician on call contact you.   Following upper endoscopy (EGD)  Vomiting of blood or coffee ground material  New  chest pain or pain under the shoulder blades  Painful or persistently difficult swallowing  New shortness of breath  Fever of 100F or higher  Black, tarry-looking stools  FOLLOW UP:  Our staff will call the home number listed on your records the next business day following your procedure to check on you and address any questions or concerns that you may have at that time regarding the information given to you following your procedure. This is a courtesy call and so if there is no answer at the home number and we have not heard from you through the emergency physician on call, we will assume that you have returned to your regular daily activities without incident.  SIGNATURES/CONFIDENTIALITY: You and/or your care partner have signed paperwork which will be entered into your electronic medical record.  These signatures attest to the fact that that the information above on your After Visit Summary has been reviewed and is understood.  Full responsibility of the confidentiality of this discharge information lies with you and/or your care-partner.  Your prescriptions were sent to [facility] on [address]  AVOID NSAIDS (IBUPROFEN, ADVIL, ALEVE, MOTRIN) WHEN POSSIBLE

## 2014-08-04 ENCOUNTER — Telehealth: Payer: Self-pay | Admitting: *Deleted

## 2014-08-04 NOTE — Telephone Encounter (Signed)
  Follow up Call-  Call back number 08/03/2014  Post procedure Call Back phone  # [phone]  Permission to leave phone message Yes     Patient questions:  Do you have a fever, pain , or abdominal swelling? No. Pain Score  0 *  Have you tolerated food without any problems? Yes.    Have you been able to return to your normal activities? Yes.    Do you have any questions about your discharge instructions: Diet   No. Medications  No. Follow up visit  No.  Do you have questions or concerns about your Care? No.  Actions: * If pain score is 4 or above: No action needed, pain <4.

## 2014-08-25 ENCOUNTER — Telehealth: Payer: Self-pay | Admitting: Internal Medicine

## 2014-08-25 ENCOUNTER — Encounter: Payer: Self-pay | Admitting: Internal Medicine

## 2014-08-25 ENCOUNTER — Ambulatory Visit (INDEPENDENT_AMBULATORY_CARE_PROVIDER_SITE_OTHER): Payer: BC Managed Care – PPO | Admitting: Internal Medicine

## 2014-08-25 VITALS — BP 144/83 | HR 89 | Temp 98.5°F | Ht 65.0 in | Wt 213.2 lb

## 2014-08-25 DIAGNOSIS — M79673 Pain in unspecified foot: Secondary | ICD-10-CM

## 2014-08-25 DIAGNOSIS — E785 Hyperlipidemia, unspecified: Secondary | ICD-10-CM

## 2014-08-25 DIAGNOSIS — K2210 Ulcer of esophagus without bleeding: Secondary | ICD-10-CM

## 2014-08-25 DIAGNOSIS — Z8249 Family history of ischemic heart disease and other diseases of the circulatory system: Secondary | ICD-10-CM

## 2014-08-25 MED ORDER — ATORVASTATIN CALCIUM 10 MG PO TABS: 10.0000 mg | ORAL_TABLET | Freq: Every day | ORAL | Status: DC

## 2014-08-25 NOTE — Telephone Encounter (Signed)
Caller name: Navreet Relation to pt: Call back [phone] Pharmacy: [facility].  Fax # [phone]  Reason for call: pt needs refill on Rx:  Also check for atorvastatin (LIPITOR) 10 MG   -- to see if it was sent to [facility] too.   venlafaxine XR (EFFEXOR-XR) 75 MG 24 hr capsule  - 90 day supply

## 2014-08-25 NOTE — Patient Instructions (Signed)
Please come back to the office in 3 months for a physical exam. Come back fasting

## 2014-08-25 NOTE — Assessment & Plan Note (Signed)
Esophageal ulcer, diagnosed by EGD 08/03/2014, currently feeling better, plan is to repeat the colonoscopy in 8-12 weeks.

## 2014-08-25 NOTE — Assessment & Plan Note (Signed)
Family history of CAD,  was taking aspirin 81 mg daily, currently holding it d/t  esophageal issues. Recommend that is okay to go back on ASA once ulcer healed

## 2014-08-25 NOTE — Progress Notes (Signed)
Subjective:    Patient ID: Misty [name], female    DOB: 25-Dec-1965, 48 y.o.   MRN: [id number]  DOS:  08/25/2014 Type of visit - description : To get established, transferring from Dr. Linna Darner Interval history: Went to the ER with chest pain, subsequently refer to GI, EGD --> esophageal ulcer, feeling better. High cholesterol, on Lipitor, causing some aches and pains, go back on crestor?. See assessment and plan Right heel pain for several weeks, not better with stretching. Local injection?    ROS Denies chest or difficulty breathing No  nausea, vomiting, diarrhea or blood in the stools. No dysphagia and odynophagia. Arthralgias, patient thinks related to statin , no synovits,  Past Medical History  Diagnosis Date  . Anxiety state, unspecified   . Asthma   . Depressive disorder   . Other and unspecified hyperlipidemia   . Lactose intolerance   . Osteoarthritis     Dr.Collins   . Nonspecific abnormal results of thyroid function study   . Allergy     seasonal  . GERD (gastroesophageal reflux disease)     Past Surgical History  Procedure Laterality Date  . Tonsillectomy and adenoidectomy    . Ganglion cyst excision      Right Wrist   . Bilateral knee arthroscopy      Left: 11/10/2008, Right: 04/15/2009. Dr.Andy Collins  . G 1 p 1      History   Social History  . Marital Status: Married    Spouse Name: N/A    Number of Children: N/A  . Years of Education: N/A   Occupational History  . [employer]    Social History Main Topics  . Smoking status: Never Smoker   . Smokeless tobacco: Never Used  . Alcohol Use: 1.2 oz/week    2 Glasses of wine per week     Comment: Socially  . Drug Use: No  . Sexual Activity: Not on file   Other Topics Concern  . Not on file   Social History Narrative  . No narrative on file     Family History  Problem Relation Age of Onset  . COPD Mother     smoker  . Heart disease Mother     stent @ 51; ablation for  tachycardia  . Heart attack Father 49  . Lung disease Father     mesothelioma  . Hypertension Father   . Hyperlipidemia Sister   . Diabetes Maternal Aunt   . Lung cancer Maternal Grandmother   . Stroke Maternal Grandfather   . Breast cancer Paternal Grandmother   . Osteopenia Sister   . Heart attack Brother 78  . Esophageal cancer Neg Hx   . Stomach cancer Neg Hx        Medication List       This list is accurate as of: 08/25/14  3:19 PM.  Always use your most recent med list.               atorvastatin 10 MG tablet  Commonly known as:  LIPITOR  Take 1 tablet (10 mg total) by mouth daily.     BIOTIN PO  Take by mouth.     budesonide-formoterol 80-4.5 MCG/ACT inhaler  Commonly known as:  SYMBICORT  Inhale 2 puffs into the lungs 2 (two) times daily.     CALCIUM MAGNESIUM PO  Take by mouth.     CLARINEX PO  Take by mouth as needed.     FISH OIL  PO  Take by mouth.     GLUCOSAMINE SULFATE PO  Take by mouth.     HYDROcodone-acetaminophen 5-325 MG per tablet  Commonly known as:  NORCO/VICODIN  Take 1 tablet by mouth every 6 (six) hours as needed for moderate pain.     montelukast 10 MG tablet  Commonly known as:  SINGULAIR  Take 10 mg by mouth at bedtime.     pantoprazole 40 MG tablet  Commonly known as:  PROTONIX  Take 1 tablet 30 minutes before your breakfast and 30 minutes before your supper for 8 weeks     PROAIR HFA 108 (90 BASE) MCG/ACT inhaler  Generic drug:  albuterol  Inhale into the lungs every 6 (six) hours as needed for wheezing or shortness of breath.     sucralfate 1 GM/10ML suspension  Commonly known as:  CARAFATE  Take 1 dose (10 mL) before each meal and at bedtime     venlafaxine XR 75 MG 24 hr capsule  Commonly known as:  EFFEXOR-XR  1 BY MOUTH DAILY           Objective:   Physical Exam BP 144/83  Pulse 89  Temp(Src) 98.5 F (36.9 C) (Oral)  Ht 5\' 5"  (1.651 m)  Wt 213 lb 4 oz (96.73 kg)  BMI 35.49 kg/m2  SpO2 97%  LMP  07/23/2014 General -- alert, well-developed, NAD.    Lungs -- normal respiratory effort, no intercostal retractions, no accessory muscle use, and normal breath sounds.  Heart-- normal rate, regular rhythm, no murmur.   Extremities-- no pretibial edema bilaterally ; ankles-wrists-- normal to inspection-palpation; heels no TTP Neurologic--  alert & oriented X3. Speech normal, gait appropriate for age, strength symmetric and appropriate for age.  Psych-- Cognition and judgment appear intact. Cooperative with normal attention span and concentration. No anxious or depressed appearing.        Assessment & Plan:    Heel pain, refer to sports medicine for consideration of local injection   Today , I spent more than 30   min with the patient: >50% of the time counseling regards statins options, pro-cons ASA, also reviewing the chart and labs ordered by other providers     F2F 30 min

## 2014-08-25 NOTE — Progress Notes (Signed)
Pre visit review using our clinic review tool, if applicable. No additional management support is needed unless otherwise documented below in the visit note. 

## 2014-08-25 NOTE — Assessment & Plan Note (Signed)
High cholesterol In the past she took a generic statin and was tired, had aches; was doing very well w/ a low dose of Crestor, d/t cost  was switch to Lipitor and now has mild aches and pains. Prolonged discussion about going back to Crestor or not. She elected to stay on lipitor for now but will call if changes her mind. F/u 3 months fasting

## 2014-08-26 MED ORDER — VENLAFAXINE HCL ER 75 MG PO CP24: ORAL_CAPSULE | ORAL | Status: DC

## 2014-08-26 MED ORDER — ATORVASTATIN CALCIUM 10 MG PO TABS: 10.0000 mg | ORAL_TABLET | Freq: Every day | ORAL | Status: DC

## 2014-08-26 NOTE — Telephone Encounter (Signed)
Both medications refilled to [facility].

## 2014-08-28 ENCOUNTER — Ambulatory Visit: Payer: BC Managed Care – PPO | Admitting: Family Medicine

## 2014-09-02 ENCOUNTER — Ambulatory Visit (INDEPENDENT_AMBULATORY_CARE_PROVIDER_SITE_OTHER): Payer: BC Managed Care – PPO | Admitting: Family Medicine

## 2014-09-02 ENCOUNTER — Encounter: Payer: Self-pay | Admitting: Family Medicine

## 2014-09-02 VITALS — BP 127/83 | HR 92 | Ht 66.0 in | Wt 210.0 lb

## 2014-09-02 DIAGNOSIS — M722 Plantar fascial fibromatosis: Secondary | ICD-10-CM

## 2014-09-02 MED ORDER — METHYLPREDNISOLONE ACETATE 40 MG/ML IJ SUSP
40.0000 mg | Freq: Once | INTRAMUSCULAR | Status: AC
  Administered 2014-09-02: 40 mg via INTRA_ARTICULAR

## 2014-09-02 NOTE — Patient Instructions (Signed)
You have plantar fasciitis Take tylenol or aleve as needed for pain  Plantar fascia stretch for 20-30 seconds (do 3 of these) in morning Lowering/raise on a step exercises 3 x 10 once or twice a day - this is very important for long term recovery. Can add heel walks, toe walks forward and backward as well Ice heel for 15 minutes as needed. Avoid flat shoes/barefoot walking as much as possible. Arch straps have been shown to help with pain. Heel lifts may help with pain by avoiding fully stretching the plantar fascia except when doing home exercises. Consider Dr. Zoe Lan active series insoles. Steroid injection is a consideration for short term pain relief if you are struggling - you were given this today. Physical therapy is also an option. Follow up with me in 6 weeks.

## 2014-09-07 ENCOUNTER — Encounter: Payer: Self-pay | Admitting: Family Medicine

## 2014-09-07 DIAGNOSIS — M722 Plantar fascial fibromatosis: Secondary | ICD-10-CM | POA: Insufficient documentation

## 2014-09-07 NOTE — Assessment & Plan Note (Signed)
Shown home exercise program and stretches to do daily.  Icing, arch supports, arch binders.  Injection given today as well.  NSAIDs if needed.  Avoid flat shoes and barefoot walking. Consider physical therapy if not improving.  F/u in 6 weeks.  After informed written consent patient was seated on exam table.  Area overlying medial aspect of right plantar fascia insertion on calcaneus prepped with alcohol swab.  Utilizing medial approach under ultrasound guidance patient's right plantar fascia was injected with 2:1 marcaine: depomedrol.  Patient tolerated procedure well without immediate complications.

## 2014-09-07 NOTE — Progress Notes (Signed)
Patient ID: Misty [name], female   DOB: [birth year], 48 y.o.   MRN: [id number]  PCP and referred by: Kathlene November, MD  Subjective:   HPI: Patient is a 48 y.o. female here for heel pain.  Patient reports she's had about 1 month of plantar right heel pain. No known injury. Had problems with a heel spur or plantar fasciitis in 1995 that felt similar to this (unsure which foot). No swelling or bruising. No injury or trauma. Taking aleve without much benefit. Gets up to 10/10 level of pain.  Past Medical History  Diagnosis Date  . Anxiety and depression   . Asthma   . Other and unspecified hyperlipidemia   . Lactose intolerance   . Osteoarthritis     Dr.Collins   . Nonspecific abnormal results of thyroid function study   . Allergy     seasonal  . GERD (gastroesophageal reflux disease)   . Esophageal ulcer     by EGD 08-03-2014    Current Outpatient Prescriptions on File Prior to Visit  Medication Sig Dispense Refill  . albuterol (PROAIR HFA) 108 (90 BASE) MCG/ACT inhaler Inhale into the lungs every 6 (six) hours as needed for wheezing or shortness of breath.      Marland Kitchen atorvastatin (LIPITOR) 10 MG tablet Take 1 tablet (10 mg total) by mouth daily.  90 tablet  1  . BIOTIN PO Take by mouth.      . budesonide-formoterol (SYMBICORT) 80-4.5 MCG/ACT inhaler Inhale 2 puffs into the lungs 2 (two) times daily.      . Calcium-Magnesium-Vitamin D (CALCIUM MAGNESIUM PO) Take by mouth.      . Desloratadine (CLARINEX PO) Take by mouth as needed.      Marland Kitchen GLUCOSAMINE SULFATE PO Take by mouth.      . montelukast (SINGULAIR) 10 MG tablet Take 10 mg by mouth at bedtime.      . Omega-3 Fatty Acids (FISH OIL PO) Take by mouth.      . pantoprazole (PROTONIX) 40 MG tablet Take 1 tablet 30 minutes before your breakfast and 30 minutes before your supper for 8 weeks  60 tablet  3  . sucralfate (CARAFATE) 1 GM/10ML suspension Take 1 dose (10 mL) before each meal and at bedtime  420 mL  1  . venlafaxine XR  (EFFEXOR-XR) 75 MG 24 hr capsule Take 1 tablet by mouth daily  90 capsule  1   No current facility-administered medications on file prior to visit.    Past Surgical History  Procedure Laterality Date  . Tonsillectomy and adenoidectomy    . Ganglion cyst excision      Right Wrist   . Bilateral knee arthroscopy      Left: 11/10/2008, Right: 04/15/2009. Dr.Andy Collins  . G 1 p 1      Allergies  Allergen Reactions  . Penicillins     Unspecified reaction as an infant    History   Social History  . Marital Status: Married    Spouse Name: N/A    Number of Children: 1  . 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 Smoking status: Never Smoker   . Smokeless tobacco: Never Used  . Alcohol Use: 1.2 oz/week    2 Glasses of wine per week     Comment: Socially  . Drug Use: No  . Sexual Activity: Not on file   Other Topics Concern  .  Not on file   Social History Narrative   Household-- pt and child   divorced    Family History  Problem Relation Age of Onset  . COPD Mother     smoker  . Heart disease Mother     stent @ 33; ablation for tachycardia  . Heart attack Father 28  . Lung disease Father     mesothelioma  . Hypertension Father   . Hyperlipidemia Sister   . Diabetes Maternal Aunt   . Lung cancer Maternal Grandmother   . Stroke Maternal Grandfather   . Breast cancer Paternal Grandmother   . Osteopenia Sister   . Heart attack Brother 59  . Esophageal cancer Neg Hx   . Stomach cancer Neg Hx     BP 127/83  Pulse 92  Ht 5\' 6"  (1.676 m)  Wt 210 lb (95.255 kg)  BMI 33.91 kg/m2  LMP 07/23/2014  Review of Systems: See HPI above.    Objective:  Physical Exam:  Gen: NAD  Right foot/ankle: No gross deformity, swelling, ecchymoses FROM without pain. TTP plantar fascia at insertion on calcaneus. Negative ant drawer and talar tilt.   Negative syndesmotic compression. Negative  calcaneal squeeze. Thompsons test negative. NV intact distally.    Assessment & Plan:  1. Right foot plantar fasciitis - Shown home exercise program and stretches to do daily.  Icing, arch supports, arch binders.  Injection given today as well.  NSAIDs if needed.  Avoid flat shoes and barefoot walking. Consider physical therapy if not improving.  F/u in 6 weeks.  After informed written consent patient was seated on exam table.  Area overlying medial aspect of right plantar fascia insertion on calcaneus prepped with alcohol swab.  Utilizing medial approach under ultrasound guidance patient's right plantar fascia was injected with 2:1 marcaine: depomedrol.  Patient tolerated procedure well without immediate complications.

## 2014-10-06 ENCOUNTER — Encounter: Payer: BC Managed Care – PPO | Admitting: Internal Medicine

## 2014-12-09 ENCOUNTER — Encounter: Payer: BC Managed Care – PPO | Admitting: Internal Medicine

## 2014-12-16 ENCOUNTER — Encounter: Payer: BC Managed Care – PPO | Admitting: Internal Medicine

## 2015-02-09 ENCOUNTER — Other Ambulatory Visit: Payer: Self-pay

## 2015-02-09 MED ORDER — VENLAFAXINE HCL ER 75 MG PO CP24: ORAL_CAPSULE | ORAL | Status: DC

## 2015-02-16 ENCOUNTER — Telehealth: Payer: Self-pay | Admitting: Internal Medicine

## 2015-02-16 NOTE — Telephone Encounter (Signed)
Pre visit letter sent  °

## 2015-03-05 ENCOUNTER — Encounter: Payer: Self-pay | Admitting: Internal Medicine

## 2015-04-09 ENCOUNTER — Telehealth: Payer: Self-pay | Admitting: *Deleted

## 2015-04-09 NOTE — Telephone Encounter (Signed)
Unable to reach patient at time of Pre-Visit Call.  Left message for patient to return call when available.    

## 2015-04-12 ENCOUNTER — Encounter: Payer: Self-pay | Admitting: Internal Medicine

## 2015-04-12 ENCOUNTER — Ambulatory Visit (INDEPENDENT_AMBULATORY_CARE_PROVIDER_SITE_OTHER): Payer: BLUE CROSS/BLUE SHIELD | Admitting: Internal Medicine

## 2015-04-12 VITALS — BP 128/84 | HR 90 | Temp 98.2°F | Ht 66.0 in | Wt 200.5 lb

## 2015-04-12 DIAGNOSIS — E785 Hyperlipidemia, unspecified: Secondary | ICD-10-CM

## 2015-04-12 DIAGNOSIS — K2210 Ulcer of esophagus without bleeding: Secondary | ICD-10-CM

## 2015-04-12 DIAGNOSIS — Z23 Encounter for immunization: Secondary | ICD-10-CM | POA: Diagnosis not present

## 2015-04-12 DIAGNOSIS — F329 Major depressive disorder, single episode, unspecified: Secondary | ICD-10-CM

## 2015-04-12 DIAGNOSIS — F32A Depression, unspecified: Secondary | ICD-10-CM

## 2015-04-12 DIAGNOSIS — Z0000 Encounter for general adult medical examination without abnormal findings: Secondary | ICD-10-CM

## 2015-04-12 DIAGNOSIS — J4530 Mild persistent asthma, uncomplicated: Secondary | ICD-10-CM

## 2015-04-12 LAB — CBC WITH DIFFERENTIAL/PLATELET
BASOS ABS: 0 10*3/uL (ref 0.0–0.1)
Basophils Relative: 0.7 % (ref 0.0–3.0)
EOS ABS: 0.2 10*3/uL (ref 0.0–0.7)
Eosinophils Relative: 2.3 % (ref 0.0–5.0)
HEMATOCRIT: 37.1 % (ref 36.0–46.0)
HEMOGLOBIN: 12.7 g/dL (ref 12.0–15.0)
LYMPHS PCT: 19.8 % (ref 12.0–46.0)
Lymphs Abs: 1.3 10*3/uL (ref 0.7–4.0)
MCHC: 34.2 g/dL (ref 30.0–36.0)
MCV: 92.8 fl (ref 78.0–100.0)
MONOS PCT: 6.6 % (ref 3.0–12.0)
Monocytes Absolute: 0.4 10*3/uL (ref 0.1–1.0)
NEUTROS ABS: 4.7 10*3/uL (ref 1.4–7.7)
Neutrophils Relative %: 70.6 % (ref 43.0–77.0)
Platelets: 255 10*3/uL (ref 150.0–400.0)
RBC: 3.99 Mil/uL (ref 3.87–5.11)
RDW: 13.2 % (ref 11.5–15.5)
WBC: 6.6 10*3/uL (ref 4.0–10.5)

## 2015-04-12 LAB — LIPID PANEL
Cholesterol: 164 mg/dL (ref 0–200)
HDL: 67.7 mg/dL (ref >39.00)
LDL CALC: 85 mg/dL (ref 0–99)
NonHDL: 96.3
Total CHOL/HDL Ratio: 2
Triglycerides: 58 mg/dL (ref 0.0–149.0)
VLDL: 11.6 mg/dL (ref 0.0–40.0)

## 2015-04-12 LAB — BASIC METABOLIC PANEL
BUN: 8 mg/dL (ref 6–23)
CHLORIDE: 104 meq/L (ref 96–112)
CO2: 25 mEq/L (ref 19–32)
Calcium: 9 mg/dL (ref 8.4–10.5)
Creatinine, Ser: 0.66 mg/dL (ref 0.40–1.20)
GFR: 101.56 mL/min (ref >60.00)
GLUCOSE: 97 mg/dL (ref 70–99)
Potassium: 3.9 mEq/L (ref 3.5–5.1)
Sodium: 137 mEq/L (ref 135–145)

## 2015-04-12 LAB — ALT: ALT: 15 U/L (ref 0–35)

## 2015-04-12 LAB — AST: AST: 25 U/L (ref 0–37)

## 2015-04-12 LAB — TSH: TSH: 0.81 u[IU]/mL (ref 0.35–4.50)

## 2015-04-12 MED ORDER — VENLAFAXINE HCL ER 37.5 MG PO CP24: 37.5000 mg | ORAL_CAPSULE | Freq: Every day | ORAL | Status: DC

## 2015-04-12 NOTE — Patient Instructions (Addendum)
Get your blood work before you leave   Effexor XR 37.5: Alternate 2 tablets daily with one tablet daily In 6 weeks  go to one tablet daily only   Come back to the office 4 months for a routine check up   Base on your cholesterol results, will switch to Crestor

## 2015-04-12 NOTE — Assessment & Plan Note (Signed)
Patient notes good adherence to medication but states has mild side effects (myalgia and fatigue) fromf Lipitor.  We discussed the option of switching back to Crestor , cost may be a problem but a coupon was provided .  Will check FLP today and make decision on dosage pending results. She is a nonsmoker but  father, mother and brother had a heart attack in their 78s.

## 2015-04-12 NOTE — Assessment & Plan Note (Signed)
Patient states Effexor is effective, would like to decrease dosage with intent of eventually discontinuing medication.  Plan: Will decrease dosage to alternating daily doses of 75 mg and 37.5mg  for 6 weeks, then decreasing to 37.5 mg daily until follow-up in 4 months.  Okay to use Effexor every other day and then stop if the patient feels ready

## 2015-04-12 NOTE — Assessment & Plan Note (Signed)
Patient doing well following treatment. Denies any dysphagia or odynophagia. Patient does note occasional (1x/month) reflux and is able to avoid trigger foods. Patient instructed to contact us if symptoms increase in frequency or if new symptoms develop.  Was unable to follow-up with GI for repeated endoscopy due to cost

## 2015-04-12 NOTE — Progress Notes (Signed)
Pre visit review using our clinic review tool, if applicable. No additional management support is needed unless otherwise documented below in the visit note. 

## 2015-04-12 NOTE — Progress Notes (Signed)
Subjective:    Patient ID: Misty [name], female    DOB: 05/16/1966, 49 y.o.   MRN: [id number]  DOS:  04/12/2015 Type of visit - description : cpx Interval history:  Patient is a 49 year old female with a history of hyperlipidemia, esophageal ulcer, and depression in today for her annual physical.  Esophageal ulcer: Patient had an esophageal ulcer in 9/15, and saw GI. Patient was instructed to perform a follow-up EGD to confirm resolution and biopsy for esophageal esophagitis but patient could not afford procedure. Has not had any dysphagia or odynophagia since. She does note rarely occurring reflux   Depression: States this is well-controlled with effexor, patient would like to decrease the dosage with the intent of eventually discontinuing the medication.   Hyperlipidemia: Originally taking Lipitor, patient switched to Crestor due to mild side effects. Patient then switched back to Lipitor due to cost. Patient notes mild myalgias and general fatigue that she attributes to the Lipitor. Patient expresses interest in returning to Crestor but is still concerned about the cost.  Weight gain: Patient states interest in starting Belviq, she states that she has gained a lot of weight over the last few years and wants to discuss beginning this medication.  Review of Systems  Constitutional: No fever. No chills. No unexplained weight changes. No unusual sweats  HEENT: No dental problems, no ear discharge, no facial swelling, no voice changes. No eye discharge, no eye redness, no intolerance to light   Respiratory: No wheezing, no difficulty breathing. No cough, no mucus production  Cardiovascular: No CP, no leg swelling, no palpitations  GI: no nausea, no vomiting, no diarrhea, no abdominal pain.  No blood in the stools. No dysphagia, no odynophagia    Endocrine: No polyphagia, no polyuria, no polydipsia  GU: No dysuria, gross hematuria, difficulty urinating. No urinary urgency, no  frequency.  Musculoskeletal: As per HPI  Skin: No change in the color of the skin, pallor, no rash  Allergic, immunologic: Seasonal allergies, no food allergies  Neurological: No dizziness no  syncope. No headaches. No diplopia, no slurred, no slurred speech, no motor deficits, no facial  Numbness  Hematological: No enlarged lymph nodes, no easy bruising , no unusual bleedings  Psychiatry: No suicidal ideas, no hallucinations, no behavior problems, no confusion.  No unusual/severe anxiety, no depression    Past Medical History  Diagnosis Date  . Anxiety and depression   . Asthma   . Other and unspecified hyperlipidemia   . Lactose intolerance   . Osteoarthritis     Dr.Collins   . Nonspecific abnormal results of thyroid function study   . Allergy     seasonal  . GERD (gastroesophageal reflux disease)   . Esophageal ulcer     by EGD 08-03-2014    Past Surgical History  Procedure Laterality Date  . Tonsillectomy and adenoidectomy    . Ganglion cyst excision      Right Wrist   . Bilateral knee arthroscopy      Left: 11/10/2008, Right: 04/15/2009. Dr.Andy Collins  . G 1 p 1      History   Social History  . Marital Status: Married    Spouse Name: N/A  . Number of Children: 1  . 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 Smoking status: Never Smoker   . Smokeless tobacco: Never Used  . Alcohol Use: 1.2 oz/week  2 Glasses of wine per week     Comment: Socially  . Drug Use: No  . Sexual Activity: Not on file   Other Topics Concern  . Not on file   Social History Narrative   Household-- pt and  Child 2003   divorced     Family History  Problem Relation Age of Onset  . COPD Mother     smoker  . Heart disease Mother     stent @ 62; ablation for tachycardia  . Heart attack Father 8  . Lung disease Father     mesothelioma  . Hypertension Father   . Hyperlipidemia Sister   .  Diabetes Maternal Aunt   . Lung cancer Maternal Grandmother   . Stroke Maternal Grandfather   . Breast cancer Paternal Grandmother   . Osteopenia Sister   . Heart attack Brother 47  . Esophageal cancer Neg Hx   . Stomach cancer Neg Hx   . Colon cancer Neg Hx       Medication List       This list is accurate as of: 04/12/15  5:47 PM.  Always use your most recent med list.               atorvastatin 10 MG tablet  Commonly known as:  LIPITOR  Take 1 tablet (10 mg total) by mouth daily.     BIOTIN PO  Take by mouth.     budesonide-formoterol 80-4.5 MCG/ACT inhaler  Commonly known as:  SYMBICORT  Inhale 2 puffs into the lungs 2 (two) times daily.     CALCIUM MAGNESIUM PO  Take by mouth.     CLARINEX PO  Take by mouth as needed.     FISH OIL PO  Take by mouth.     GLUCOSAMINE SULFATE PO  Take by mouth.     montelukast 10 MG tablet  Commonly known as:  SINGULAIR  Take 10 mg by mouth at bedtime.     pantoprazole 40 MG tablet  Commonly known as:  PROTONIX  Take 1 tablet 30 minutes before your breakfast and 30 minutes before your supper for 8 weeks     PROAIR HFA 108 (90 BASE) MCG/ACT inhaler  Generic drug:  albuterol  Inhale into the lungs every 6 (six) hours as needed for wheezing or shortness of breath.     sucralfate 1 GM/10ML suspension  Commonly known as:  CARAFATE  Take 1 dose (10 mL) before each meal and at bedtime     venlafaxine XR 37.5 MG 24 hr capsule  Commonly known as:  EFFEXOR XR  Take 1 capsule (37.5 mg total) by mouth daily with breakfast.           Objective:   Physical Exam BP 128/84 mmHg  Pulse 90  Temp(Src) 98.2 F (36.8 C) (Oral)  Ht 5\' 6"  (1.676 m)  Wt 200 lb 8 oz (90.946 kg)  BMI 32.38 kg/m2  SpO2 98%  LMP 03/31/2015  General:   Well developed, well nourished . NAD.  Neck:  Full range of motion. Supple. No  thyromegaly , normal carotid pulse HEENT:  Normocephalic . Face symmetric, atraumatic Lungs:  CTA B Normal  respiratory effort, no intercostal retractions, no accessory muscle use. Heart: RRR,  no murmur. Distal pulses intact. No pretibial edema bilaterally  Abdomen:  Not distended, soft, non-tender. No rebound or rigidity. No mass,organomegaly Skin: Exposed areas without rash. Not pale. Not jaundice Neurologic:  alert & oriented X3.  Speech  normal, gait appropriate for age and unassisted Psych: Cognition and judgment appear intact.  Cooperative with normal attention span and concentration.  Behavior appropriate. No anxious or depressed appearing.       Assessment & Plan:   (Patient seen along with Aletta Edouard, medical student)  Weight gain: Patient expresses interest in Belviq due to significant weight gain over the last few years. We counseled on importance of improving diet and increasing exercise before beginning pharmacotherapy for weight gain.

## 2015-04-12 NOTE — Assessment & Plan Note (Signed)
Td 2010 Pneumovax today (h/o asthma)  Female care - per gyn [facility] - pending (appt. 04/30/15) Never had a cscope Labs: BMP/LFTs/FLP/CBC/TSH Diet -exercise discussed

## 2015-04-14 ENCOUNTER — Other Ambulatory Visit: Payer: Self-pay

## 2015-04-14 MED ORDER — VENLAFAXINE HCL ER 37.5 MG PO CP24: 37.5000 mg | ORAL_CAPSULE | Freq: Every day | ORAL | Status: DC

## 2015-04-28 ENCOUNTER — Telehealth: Payer: Self-pay | Admitting: Internal Medicine

## 2015-04-28 MED ORDER — ATORVASTATIN CALCIUM 10 MG PO TABS: 10.0000 mg | ORAL_TABLET | Freq: Every day | ORAL | Status: DC

## 2015-04-28 NOTE — Telephone Encounter (Signed)
Caller name: Kristalynn Coddington Relationship to patient: self Can be reached: [phone] Pharmacy: [facility] on [address]  Reason for call: Pt needs 30 day supply of atorvastatin (LIPITOR) 10 MG tablet sent to local pharmacy.

## 2015-04-28 NOTE — Telephone Encounter (Signed)
Rx sent to [facility] as requested.  

## 2015-06-04 ENCOUNTER — Encounter: Payer: Self-pay | Admitting: Internal Medicine

## 2015-06-18 ENCOUNTER — Telehealth: Payer: Self-pay | Admitting: Internal Medicine

## 2015-06-18 MED ORDER — ATORVASTATIN CALCIUM 10 MG PO TABS: 10.0000 mg | ORAL_TABLET | Freq: Every day | ORAL | Status: DC

## 2015-06-18 NOTE — Telephone Encounter (Signed)
Caller name:bridgett Relationship to patient:self Can be [phone] [facility] [address]   Reason for call:Her mail order was to contact us and have a 90 day sent 2 weeks ago  They did not  She is out of lipitor.  She wants a 30 day rx to [facility] on [address] nad a 90 day to [place]

## 2015-06-18 NOTE — Telephone Encounter (Signed)
Lipitor sent to [facility] on [address] on 04/28/15 for 30 with 3 refills.  Notified patient via voicemail that she should have available refills at pharmacy.  Rx sent to [facility] as well.

## 2015-08-03 ENCOUNTER — Telehealth: Payer: Self-pay | Admitting: Internal Medicine

## 2015-08-03 NOTE — Telephone Encounter (Signed)
Pt called stating she would like to go back to effexor 75mg . The 37.5mg  is not working as well. Pt would like it sent to [facility] in 90 day supply with refills. Advised pt due for f/u in September per AVS 03/2015. Pt states she thought she was to continue as planned and f/u 1 year.

## 2015-08-03 NOTE — Telephone Encounter (Signed)
Please advise 

## 2015-08-03 NOTE — Telephone Encounter (Signed)
Okay to go back to the original dose of Effexor, send 90 days and 2 refills. Recent labs were very good, if she feels well , okay to follow-up for a physical by 03/2016 instead of September.

## 2015-08-04 MED ORDER — VENLAFAXINE HCL ER 75 MG PO CP24: 75.0000 mg | ORAL_CAPSULE | Freq: Every day | ORAL | Status: DC

## 2015-08-04 NOTE — Telephone Encounter (Signed)
Effexor 75 mg sent to [facility], #90 and 2 refills.

## 2015-09-26 ENCOUNTER — Other Ambulatory Visit: Payer: Self-pay | Admitting: Internal Medicine

## 2016-01-19 HISTORY — PX: SPIROMETRY: SHX456

## 2016-02-23 ENCOUNTER — Ambulatory Visit (INDEPENDENT_AMBULATORY_CARE_PROVIDER_SITE_OTHER): Payer: BLUE CROSS/BLUE SHIELD | Admitting: Internal Medicine

## 2016-02-23 ENCOUNTER — Encounter: Payer: Self-pay | Admitting: Internal Medicine

## 2016-02-23 VITALS — BP 116/74 | HR 83 | Temp 98.2°F | Ht 66.0 in | Wt 182.0 lb

## 2016-02-23 DIAGNOSIS — M7989 Other specified soft tissue disorders: Secondary | ICD-10-CM | POA: Diagnosis not present

## 2016-02-23 DIAGNOSIS — Z09 Encounter for follow-up examination after completed treatment for conditions other than malignant neoplasm: Secondary | ICD-10-CM

## 2016-02-23 DIAGNOSIS — M79604 Pain in right leg: Secondary | ICD-10-CM

## 2016-02-23 NOTE — Patient Instructions (Signed)
We are ordering a ultrasound of the leg, it should be happening tomorrow.  In the meantime if you have severe symptoms, swelling, redness, fever: Please call the office

## 2016-02-23 NOTE — Progress Notes (Signed)
Pre visit review using our clinic review tool, if applicable. No additional management support is needed unless otherwise documented below in the visit note. 

## 2016-02-23 NOTE — Progress Notes (Signed)
Subjective:    Patient ID: Misty [name], female    DOB: November 01, 1966, 50 y.o.   MRN: [id number]  DOS:  02/23/2016 Type of visit - description : To visit, concerned about a clot Interval history: For the last 2 weeks has noted some pain from the right knee down, the pain encompass to lesser degree the ankle. Yesterday, noted hair right calf to be swollen. She is concerned because has a sister with a history of a clot.    Review of Systems Fever chills No chest pain, difficulty breathing or palpitations No recent airplane trips but she commutes back and forward to work a total of 2 hours daily    Past Medical History  Diagnosis Date  . Anxiety and depression   . Asthma   . Other and unspecified hyperlipidemia   . Lactose intolerance   . Osteoarthritis     Dr.Collins   . Nonspecific abnormal results of thyroid function study   . Allergy     seasonal  . GERD (gastroesophageal reflux disease)   . Esophageal ulcer     by EGD 08-03-2014  . Depression 05/12/2009    Qualifier: Diagnosis of  By: Linna Darner MD, Gwyndolyn Saxon      Past Surgical History  Procedure Laterality Date  . Tonsillectomy and adenoidectomy    . Ganglion cyst excision      Right Wrist   . Bilateral knee arthroscopy      Left: 11/10/2008, Right: 04/15/2009. Dr.Andy Collins  . G 1 p 1    . Spirometry  01/2016    Social History   Social History  . Marital Status: Married    Spouse Name: N/A  . Number of Children: 1  . 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 Smoking status: Never Smoker   . Smokeless tobacco: Never Used  . Alcohol Use: 1.2 oz/week    2 Glasses of wine per week     Comment: Socially  . Drug Use: No  . Sexual Activity: Not on file   Other Topics Concern  . Not on file   Social History Narrative   Household-- pt and  Child 2003   divorced        Medication List       This list is accurate as of: 02/23/16  11:59 PM.  Always use your most recent med list.               aspirin 81 MG tablet  Take 81 mg by mouth daily. Reported on 02/23/2016     atorvastatin 10 MG tablet  Commonly known as:  LIPITOR  Take 1 tablet by mouth  daily     BIOTIN PO  Take by mouth.     budesonide-formoterol 80-4.5 MCG/ACT inhaler  Commonly known as:  SYMBICORT  Inhale 2 puffs into the lungs 2 (two) times daily.     CALCIUM MAGNESIUM PO  Take by mouth.     CLARINEX PO  Take by mouth as needed.     FISH OIL PO  Take by mouth.     GLUCOSAMINE SULFATE PO  Take by mouth.     levonorgestrel 20 MCG/24HR IUD  Commonly known as:  MIRENA  1 each by Intrauterine route once.     montelukast 10 MG tablet  Commonly known as:  SINGULAIR  Take 10 mg by mouth at bedtime.     pantoprazole  40 MG tablet  Commonly known as:  PROTONIX  Take 1 tablet 30 minutes before your breakfast and 30 minutes before your supper for 8 weeks     PROAIR HFA 108 (90 Base) MCG/ACT inhaler  Generic drug:  albuterol  Inhale into the lungs every 6 (six) hours as needed for wheezing or shortness of breath.     venlafaxine XR 75 MG 24 hr capsule  Commonly known as:  EFFEXOR XR  Take 1 capsule (75 mg total) by mouth daily with breakfast.           Objective:   Physical Exam BP 116/74 mmHg  Pulse 83  Temp(Src) 98.2 F (36.8 C) (Oral)  Ht 5\' 6"  (1.676 m)  Wt 182 lb (82.555 kg)  BMI 29.39 kg/m2  SpO2 98%  LMP 02/05/2016 (Exact Date) General:   Well developed, well nourished . NAD.  HEENT:  Normocephalic . Face symmetric, atraumatic Lungs:  CTA B Normal respiratory effort, no intercostal retractions, no accessory muscle use. Heart: RRR,  no murmur.  Lower extremities: Knees and ankles: Normal to inspection on palpation without swelling. Calves: Right side is less than 1 cm larger in circunference compared to the left, not tender, no obvious phlebitis. Skin: Not pale. Not jaundice Neurologic:  alert & oriented X3.    Speech normal, gait appropriate for age and unassisted Psych--  Cognition and judgment appear intact.  Cooperative with normal attention span and concentration.  Behavior appropriate. No anxious or depressed appearing.      Assessment & Plan:   Assessment Hyperlipidemia Anxiety, depression Asthma  Allergies GI: GERD, esophageal ulcer EGD 2015 DJD, Dr. Theda Sers   Plan: Right leg pain: Patient concerned about DVT, degree is suspicious is very low but to be sure we will get a [place]. 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

## 2016-02-24 ENCOUNTER — Ambulatory Visit (HOSPITAL_BASED_OUTPATIENT_CLINIC_OR_DEPARTMENT_OTHER)
Admission: RE | Admit: 2016-02-24 | Discharge: 2016-02-24 | Disposition: A | Discharge status: Home / Self Care | Payer: BLUE CROSS/BLUE SHIELD | Source: Ambulatory Visit | Attending: Internal Medicine | Admitting: Internal Medicine

## 2016-02-24 DIAGNOSIS — M7989 Other specified soft tissue disorders: Secondary | ICD-10-CM | POA: Diagnosis present

## 2016-02-24 DIAGNOSIS — M25462 Effusion, left knee: Secondary | ICD-10-CM | POA: Insufficient documentation

## 2016-02-24 DIAGNOSIS — M79604 Pain in right leg: Secondary | ICD-10-CM | POA: Diagnosis not present

## 2016-02-24 DIAGNOSIS — Z09 Encounter for follow-up examination after completed treatment for conditions other than malignant neoplasm: Secondary | ICD-10-CM | POA: Insufficient documentation

## 2016-02-24 NOTE — Assessment & Plan Note (Signed)
Right leg pain: Patient concerned about DVT, degree is suspicious is very low but to be sure we will get a [place]. 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

## 2016-02-29 ENCOUNTER — Ambulatory Visit (INDEPENDENT_AMBULATORY_CARE_PROVIDER_SITE_OTHER): Payer: BLUE CROSS/BLUE SHIELD | Admitting: Family Medicine

## 2016-02-29 ENCOUNTER — Encounter: Payer: Self-pay | Admitting: Family Medicine

## 2016-02-29 VITALS — BP 136/88 | HR 81 | Ht 66.0 in | Wt 177.0 lb

## 2016-02-29 DIAGNOSIS — M25561 Pain in right knee: Secondary | ICD-10-CM | POA: Diagnosis not present

## 2016-02-29 MED ORDER — METHYLPREDNISOLONE ACETATE 40 MG/ML IJ SUSP
40.0000 mg | Freq: Once | INTRAMUSCULAR | Status: AC
  Administered 2016-02-29: 40 mg via INTRA_ARTICULAR

## 2016-02-29 NOTE — Patient Instructions (Signed)
Your knee pain is due to arthritis. These are the 4 classes of medicine you can take for this: Tylenol 500mg  1-2 tabs three times a day for pain. Aleve 1-2 tabs twice a day with food Glucosamine sulfate 750mg  twice a day is a supplement that may help. Capsaicin, aspercreme, or biofreeze topically up to four times a day may also help with pain. Cortisone injections are an option - you were given this today. If cortisone injections do not help, there are different types of shots that may help but they take longer to take effect. It's important that you continue to stay active. Straight leg raises, knee extensions 3 sets of 10 once a day (add ankle weight if these become too easy). Consider physical therapy to strengthen muscles around the joint that hurts to take pressure off of the joint itself. Shoe inserts with good arch support may be helpful. Heat or ice 15 minutes at a time 3-4 times a day as needed to help with pain. Water aerobics and cycling with low resistance are the best two types of exercise for arthritis. Follow up with me as needed. Have a great trip!

## 2016-03-02 NOTE — Progress Notes (Signed)
PCP: Kathlene November, MD  Subjective:   HPI: Patient is a 50 y.o. female here for right knee pain.  Patient reports she's had about 1 month of anterior right knee pain. Pain is sharp from medial anterior knee to posterior. Can radiate down to ankle. Planning on going to [place] this weekend and do a lot of walking. Has history of torn meniscus, arthroscopy. Slight swelling. No numbness, skin changes.  Past Medical History  Diagnosis Date  . Anxiety and depression   . Asthma   . Other and unspecified hyperlipidemia   . Lactose intolerance   . Osteoarthritis     Dr.Collins   . Nonspecific abnormal results of thyroid function study   . Allergy     seasonal  . GERD (gastroesophageal reflux disease)   . Esophageal ulcer     by EGD 08-03-2014  . Depression 05/12/2009    Qualifier: Diagnosis of  By: Linna Darner MD, Gwyndolyn Saxon      Current Outpatient Prescriptions on File Prior to Visit  Medication Sig Dispense Refill  . albuterol (PROAIR HFA) 108 (90 BASE) MCG/ACT inhaler Inhale into the lungs every 6 (six) hours as needed for wheezing or shortness of breath.    Marland Kitchen aspirin 81 MG tablet Take 81 mg by mouth daily. Reported on 02/23/2016    . atorvastatin (LIPITOR) 10 MG tablet Take 1 tablet by mouth  daily 90 tablet 1  . BIOTIN PO Take by mouth.    . budesonide-formoterol (SYMBICORT) 80-4.5 MCG/ACT inhaler Inhale 2 puffs into the lungs 2 (two) times daily.    . Calcium-Magnesium-Vitamin D (CALCIUM MAGNESIUM PO) Take by mouth.    . Desloratadine (CLARINEX PO) Take by mouth as needed.    Marland Kitchen GLUCOSAMINE SULFATE PO Take by mouth.    . levonorgestrel (MIRENA) 20 MCG/24HR IUD 1 each by Intrauterine route once.    . montelukast (SINGULAIR) 10 MG tablet Take 10 mg by mouth at bedtime.    . Omega-3 Fatty Acids (FISH OIL PO) Take by mouth.    . pantoprazole (PROTONIX) 40 MG tablet Take 1 tablet 30 minutes before your breakfast and 30 minutes before your supper for 8 weeks (Patient not taking: Reported on  04/12/2015) 60 tablet 3  . venlafaxine XR (EFFEXOR XR) 75 MG 24 hr capsule Take 1 capsule (75 mg total) by mouth daily with breakfast. 90 capsule 2   No current facility-administered medications on file prior to visit.    Past Surgical History  Procedure Laterality Date  . Tonsillectomy and adenoidectomy    . Ganglion cyst excision      Right Wrist   . Bilateral knee arthroscopy      Left: 11/10/2008, Right: 04/15/2009. Dr.Andy Collins  . G 1 p 1    . Spirometry  01/2016    Allergies  Allergen Reactions  . Penicillins     Unspecified reaction as an infant    Social History   Social History  . Marital Status: Married    Spouse Name: N/A  . Number of Children: 1  . 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 Smoking status: Never Smoker   . Smokeless tobacco: Never Used  . Alcohol Use: 1.2 oz/week    2 Glasses of wine per week     Comment: Socially  . Drug Use: No  . Sexual Activity: Not on file   Other Topics Concern  . Not on file  Social History Narrative   Household-- pt and  Child 2003   divorced    Family History  Problem Relation Age of Onset  . COPD Mother     smoker  . Heart disease Mother     stent @ 81; ablation for tachycardia  . Heart attack Father 63  . Lung disease Father     mesothelioma  . Hypertension Father   . Hyperlipidemia Sister   . Diabetes Maternal Aunt   . Lung cancer Maternal Grandmother   . Stroke Maternal Grandfather   . Breast cancer Paternal Grandmother   . Osteopenia Sister   . Heart attack Brother 15  . Esophageal cancer Neg Hx   . Stomach cancer Neg Hx   . Colon cancer Neg Hx     BP 136/88 mmHg  Pulse 81  Ht 5\' 6"  (1.676 m)  Wt 177 lb (80.287 kg)  BMI 28.58 kg/m2  LMP 02/05/2016 (Exact Date)  Review of Systems: See HPI above.    Objective:  Physical Exam:  Gen: NAD, comfortable in exam room  Right knee: Small effusion.  No other  gross deformity, ecchymoses. TTP medial joint line.  No other tenderness. FROM. Negative ant/post drawers. Negative valgus/varus testing. Negative lachmanns. Negative mcmurrays, apleys, patellar apprehension. NV intact distally.  Left knee: FROM without pain.    Assessment & Plan:  1. Right knee pain - consistent with flare of DJD.  Prior history of partial meniscectomy.  Discussed tylenol, nsaids, glucosamine, topical medications, home exercise program, arch supports, ice/heat.  Given intraarticular injection today.  F/u prn.  After informed written consent, patient was lying supine on exam table. Right knee was prepped with alcohol swab and utilizing superolateral approach with ultrasound guidance, patient's right knee was injected intraarticularly with 3:1 marcaine: depomedrol. On first attempt needle tip did not reach the joint space, was hung up on synovium so not injected.  New needle utilized, re-prepped, more medial approach taken but still from superolateral approach and was successful with ultrasound guidance.  Patient tolerated the procedure well without immediate complications.

## 2016-03-02 NOTE — Assessment & Plan Note (Signed)
consistent with flare of DJD.  Prior history of partial meniscectomy.  Discussed tylenol, nsaids, glucosamine, topical medications, home exercise program, arch supports, ice/heat.  Given intraarticular injection today.  F/u prn.  After informed written consent, patient was lying supine on exam table. Right knee was prepped with alcohol swab and utilizing superolateral approach with ultrasound guidance, patient's right knee was injected intraarticularly with 3:1 marcaine: depomedrol. On first attempt needle tip did not reach the joint space, was hung up on synovium so not injected.  New needle utilized, re-prepped, more medial approach taken but still from superolateral approach and was successful with ultrasound guidance.  Patient tolerated the procedure well without immediate complications.

## 2016-04-08 ENCOUNTER — Other Ambulatory Visit: Payer: Self-pay | Admitting: Internal Medicine

## 2016-04-20 LAB — POCT ERYTHROCYTE SEDIMENTATION RATE, NON-AUTOMATED: SED RATE: 2 mm

## 2016-04-20 LAB — TSH: TSH: 0.76 u[IU]/mL (ref 0.41–5.90)

## 2016-05-24 ENCOUNTER — Other Ambulatory Visit: Payer: Self-pay

## 2016-05-24 MED ORDER — VENLAFAXINE HCL ER 75 MG PO CP24: 75.0000 mg | ORAL_CAPSULE | Freq: Every day | ORAL | Status: DC

## 2016-06-22 ENCOUNTER — Ambulatory Visit (INDEPENDENT_AMBULATORY_CARE_PROVIDER_SITE_OTHER): Payer: BLUE CROSS/BLUE SHIELD | Admitting: Internal Medicine

## 2016-06-22 ENCOUNTER — Encounter: Payer: Self-pay | Admitting: Internal Medicine

## 2016-06-22 VITALS — BP 118/76 | HR 81 | Temp 98.0°F | Resp 12 | Ht 66.0 in | Wt 174.5 lb

## 2016-06-22 DIAGNOSIS — Z0000 Encounter for general adult medical examination without abnormal findings: Secondary | ICD-10-CM

## 2016-06-22 DIAGNOSIS — Z114 Encounter for screening for human immunodeficiency virus [HIV]: Secondary | ICD-10-CM

## 2016-06-22 LAB — CBC WITH DIFFERENTIAL/PLATELET
BASOS ABS: 0 10*3/uL (ref 0.0–0.1)
BASOS PCT: 0.5 % (ref 0.0–3.0)
EOS ABS: 0.3 10*3/uL (ref 0.0–0.7)
Eosinophils Relative: 4.6 % (ref 0.0–5.0)
HCT: 38 % (ref 36.0–46.0)
Hemoglobin: 13.1 g/dL (ref 12.0–15.0)
Lymphocytes Relative: 22.7 % (ref 12.0–46.0)
Lymphs Abs: 1.4 10*3/uL (ref 0.7–4.0)
MCHC: 34.5 g/dL (ref 30.0–36.0)
MCV: 93.6 fl (ref 78.0–100.0)
MONO ABS: 0.3 10*3/uL (ref 0.1–1.0)
Monocytes Relative: 5.4 % (ref 3.0–12.0)
NEUTROS ABS: 4.1 10*3/uL (ref 1.4–7.7)
Neutrophils Relative %: 66.8 % (ref 43.0–77.0)
PLATELETS: 235 10*3/uL (ref 150.0–400.0)
RBC: 4.06 Mil/uL (ref 3.87–5.11)
RDW: 13 % (ref 11.5–15.5)
WBC: 6.1 10*3/uL (ref 4.0–10.5)

## 2016-06-22 LAB — LIPID PANEL
CHOL/HDL RATIO: 2
CHOLESTEROL: 160 mg/dL (ref 0–200)
HDL: 81.3 mg/dL (ref >39.00)
LDL CALC: 67 mg/dL (ref 0–99)
NonHDL: 78.84
TRIGLYCERIDES: 58 mg/dL (ref 0.0–149.0)
VLDL: 11.6 mg/dL (ref 0.0–40.0)

## 2016-06-22 LAB — ALT: ALT: 13 U/L (ref 0–35)

## 2016-06-22 LAB — AST: AST: 13 U/L (ref 0–37)

## 2016-06-22 NOTE — Progress Notes (Signed)
Subjective:    Patient ID: Misty [name], female    DOB: 1966/02/06, 50 y.o.   MRN: [id number]  DOS:  06/22/2016 Type of visit - description : cpx Interval history: In general feeling well, exercise has increased, diet is healthy, she is losing weight. Wt Readings from Last 3 Encounters:  06/22/16 174 lb 8 oz (79.2 kg)  02/29/16 177 lb (80.3 kg)  02/23/16 182 lb (82.6 kg)     Review of Systems  Constitutional: No fever. No chills. No unexplained wt changes. No unusual sweats  HEENT: No dental problems, no ear discharge, no facial swelling, no voice changes. No eye discharge, no eye  redness , no  intolerance to light   Respiratory: No wheezing , no  difficulty breathing. No cough , no mucus production  Cardiovascular: No CP, no leg swelling , no  Palpitations  GI: no nausea, no vomiting, no diarrhea , no  abdominal pain.  No blood in the stools. No dysphagia, no odynophagia    Endocrine: No polyphagia, no polyuria , no polydipsia  GU: No dysuria, gross hematuria, difficulty urinating. No urinary urgency, no frequency.  Musculoskeletal: No joint swellings or unusual aches or pains  Skin: No change in the color of the skin, palor , no  Rash  Allergic, immunologic: No environmental allergies , no  food allergies  Neurological: No dizziness no  syncope. No headaches. No diplopia, no slurred, no slurred speech, no motor deficits, no facial  Numbness  Hematological: No enlarged lymph nodes, no easy bruising , no unusual bleedings  Psychiatry: No suicidal ideas, no hallucinations, no beavior problems, no confusion.  No unusual/severe anxiety, no depression  Past Medical History:  Diagnosis Date  . Acne    from IUD/hormonal replacement  . Allergy    seasonal  . Anxiety and depression   . Asthma   . Esophageal ulcer    by EGD 08-03-2014  . GERD (gastroesophageal reflux disease)   . IUD (intrauterine device) in place   . Lactose intolerance   . Nonspecific abnormal  results of thyroid function study   . Osteoarthritis    Dr.Collins   . Other and unspecified hyperlipidemia     Past Surgical History:  Procedure Laterality Date  . BILATERAL KNEE ARTHROSCOPY     Left: 11/10/2008, Right: 04/15/2009. Dr.Andy Collins  . G 1 P 1    . GANGLION CYST EXCISION     Right Wrist   . SPIROMETRY  01/2016  . TONSILLECTOMY AND ADENOIDECTOMY      Social History   Social History  . Marital status: Married    Spouse name: N/A  . Number of children: 1  . Years of education: N/A   Occupational History  . [employer], managment    Social History Main Topics  . Smoking status: Never Smoker  . Smokeless tobacco: Never Used  . Alcohol use 1.2 oz/week    2 Glasses of wine per week     Comment: Socially  . Drug use: No  . Sexual activity: Not on file   Other Topics Concern  . Not on file   Social History Narrative   Household-- pt, boyfriend  and  Child 2003   divorced     Family History  Problem Relation Age of Onset  . COPD Mother     smoker  . Heart disease Mother     stent @ 52; ablation for tachycardia  . Heart attack Father 61  . Lung disease  Father     mesothelioma  . Hypertension Father   . Hyperlipidemia Sister   . Diabetes Maternal Aunt   . Lung cancer Maternal Grandmother   . Stroke Maternal Grandfather   . Breast cancer Paternal Grandmother   . Osteopenia Sister   . Heart attack Brother 63  . Esophageal cancer Neg Hx   . Stomach cancer Neg Hx   . Colon cancer Neg Hx        Medication List       Accurate as of 06/22/16  4:44 PM. Always use your most recent med list.          ADRENAL PO Take 1 tablet by mouth daily.   aspirin 81 MG tablet Take 81 mg by mouth daily. Reported on 02/23/2016   atorvastatin 10 MG tablet Commonly known as:  LIPITOR Take 1 tablet (10 mg total) by mouth daily.   B-12 PO Take 1 tablet by mouth daily.   BIOTIN PO Take 1 tablet by mouth daily.   budesonide-formoterol 80-4.5 MCG/ACT  inhaler Commonly known as:  SYMBICORT Inhale 2 puffs into the lungs 2 (two) times daily.   CALCIUM MAGNESIUM PO Take 1 tablet by mouth daily.   CLARINEX PO Take by mouth as needed.   FISH OIL PO Take 1 tablet by mouth daily.   GLUCOSAMINE SULFATE PO Take 1 tablet by mouth daily.   KELP PO Take 1 tablet by mouth daily.   levonorgestrel 20 MCG/24HR IUD Commonly known as:  MIRENA 1 each by Intrauterine route once.   montelukast 10 MG tablet Commonly known as:  SINGULAIR Take 10 mg by mouth at bedtime.   pantoprazole 40 MG tablet Commonly known as:  PROTONIX Take 1 tablet 30 minutes before your breakfast and 30 minutes before your supper for 8 weeks   PROAIR HFA 108 (90 Base) MCG/ACT inhaler Generic drug:  albuterol Inhale into the lungs every 6 (six) hours as needed for wheezing or shortness of breath.   SELENIUM PO Take 1 tablet by mouth daily.   spironolactone 50 MG tablet Commonly known as:  ALDACTONE Take 50 mg by mouth daily.   TURMERIC PO Take 1 tablet by mouth daily.   venlafaxine XR 75 MG 24 hr capsule Commonly known as:  EFFEXOR XR Take 1 capsule (75 mg total) by mouth daily with breakfast.          Objective:   Physical Exam BP 118/76 (BP Location: Left Arm, Patient Position: Sitting, Cuff Size: Normal)   Pulse 81   Temp 98 F (36.7 C) (Oral)   Resp 12   Ht 5\' 6"  (1.676 m)   Wt 174 lb 8 oz (79.2 kg)   LMP 06/07/2016 (Exact Date)   SpO2 98%   BMI 28.17 kg/m   General:   Well developed, well nourished . NAD.  Neck: No  thyromegaly  HEENT:  Normocephalic . Face symmetric, atraumatic Lungs:  CTA B Normal respiratory effort, no intercostal retractions, no accessory muscle use. Heart: RRR,  no murmur.  No pretibial edema bilaterally  Abdomen:  Not distended, soft, non-tender. No rebound or rigidity.   Skin: Exposed areas without rash. Not pale. Not jaundice Neurologic:  alert & oriented X3.  Speech normal, gait appropriate for age and  unassisted Strength symmetric and appropriate for age.  Psych: Cognition and judgment appear intact.  Cooperative with normal attention span and concentration.  Behavior appropriate. No anxious or depressed appearing.    Assessment & Plan:   Assessment  Hyperlipidemia + FH CAD Anxiety, depression Acne: From IUD, HRT, on Aldactone Allergies, asthma: Dr. Donneta Romberg DJD GERD, esophageal ulcer 2015 Birth control: IUD  PLAN: Hyperlipidemia, +FH CAD: Continue Lipitor, aspirin. Anxiety depression: On Effexor,sx controlled RTC one year

## 2016-06-22 NOTE — Assessment & Plan Note (Signed)
Hyperlipidemia, +FH CAD: Continue Lipitor, aspirin. Anxiety depression: On Effexor,sx controlled RTC one year

## 2016-06-22 NOTE — Assessment & Plan Note (Addendum)
Td 2010;  Pneumovax today (h/o asthma); recommend Prevnar but will check with her insurance first  Female care - per gyn, last visit ~ 2 weeks ago [facility] - 2 weeks ago Never had a cscope Labs:   Had recently BMP. Also 3 months ago saw a  Endocrinologist ---> TPO was increase but TSH was normal. ANA, B12 and vitamin D normal. Will check LFTs, FLP, CBC and HIV Diet -exercise: Doing great, loosing weight.

## 2016-06-22 NOTE — Patient Instructions (Signed)
GO TO THE LAB : Get the blood work     GO TO THE FRONT DESK Schedule your next appointment for a  complete physical exam in   Please think about getting immunization called PREVNAR

## 2016-06-22 NOTE — Progress Notes (Signed)
Pre visit review using our clinic review tool, if applicable. No additional management support is needed unless otherwise documented below in the visit note. 

## 2016-06-23 LAB — HIV ANTIBODY (ROUTINE TESTING W REFLEX): HIV: NONREACTIVE

## 2016-07-04 ENCOUNTER — Encounter: Payer: Self-pay | Admitting: Internal Medicine

## 2016-07-16 ENCOUNTER — Other Ambulatory Visit: Payer: Self-pay | Admitting: Internal Medicine

## 2016-07-17 ENCOUNTER — Other Ambulatory Visit: Payer: Self-pay | Admitting: Internal Medicine

## 2017-02-07 ENCOUNTER — Encounter: Payer: Self-pay | Admitting: Internal Medicine

## 2017-06-19 ENCOUNTER — Ambulatory Visit: Payer: BLUE CROSS/BLUE SHIELD | Admitting: Internal Medicine

## 2017-06-19 DIAGNOSIS — Z0289 Encounter for other administrative examinations: Secondary | ICD-10-CM

## 2017-06-20 ENCOUNTER — Ambulatory Visit: Payer: BLUE CROSS/BLUE SHIELD | Admitting: Family Medicine

## 2017-06-20 DIAGNOSIS — Z0289 Encounter for other administrative examinations: Secondary | ICD-10-CM

## 2017-06-28 ENCOUNTER — Encounter: Payer: Self-pay | Admitting: Family Medicine

## 2017-06-28 ENCOUNTER — Ambulatory Visit (INDEPENDENT_AMBULATORY_CARE_PROVIDER_SITE_OTHER): Payer: BLUE CROSS/BLUE SHIELD | Admitting: Family Medicine

## 2017-06-28 VITALS — BP 130/86 | HR 83 | Temp 98.2°F | Ht 66.0 in | Wt 194.0 lb

## 2017-06-28 DIAGNOSIS — M25572 Pain in left ankle and joints of left foot: Secondary | ICD-10-CM | POA: Diagnosis not present

## 2017-06-28 DIAGNOSIS — R600 Localized edema: Secondary | ICD-10-CM | POA: Diagnosis not present

## 2017-06-28 DIAGNOSIS — M791 Myalgia, unspecified site: Secondary | ICD-10-CM

## 2017-06-28 DIAGNOSIS — M25571 Pain in right ankle and joints of right foot: Secondary | ICD-10-CM | POA: Diagnosis not present

## 2017-06-28 LAB — COMPREHENSIVE METABOLIC PANEL
ALT: 10 U/L (ref 0–35)
AST: 9 U/L (ref 0–37)
Albumin: 4 g/dL (ref 3.5–5.2)
Alkaline Phosphatase: 64 U/L (ref 39–117)
BUN: 11 mg/dL (ref 6–23)
CO2: 29 mEq/L (ref 19–32)
Calcium: 8.7 mg/dL (ref 8.4–10.5)
Chloride: 105 mEq/L (ref 96–112)
Creatinine, Ser: 0.65 mg/dL (ref 0.40–1.20)
GFR: 102.42 mL/min (ref >60.00)
Glucose, Bld: 105 mg/dL — ABNORMAL HIGH (ref 70–99)
Potassium: 4.1 mEq/L (ref 3.5–5.1)
Sodium: 139 mEq/L (ref 135–145)
Total Bilirubin: 0.5 mg/dL (ref 0.2–1.2)
Total Protein: 6.8 g/dL (ref 6.0–8.3)

## 2017-06-28 LAB — CBC
HCT: 35.3 % — ABNORMAL LOW (ref 36.0–46.0)
Hemoglobin: 11.9 g/dL — ABNORMAL LOW (ref 12.0–15.0)
MCHC: 33.5 g/dL (ref 30.0–36.0)
MCV: 97.2 fl (ref 78.0–100.0)
Platelets: 279 10*3/uL (ref 150.0–400.0)
RBC: 3.63 Mil/uL — ABNORMAL LOW (ref 3.87–5.11)
RDW: 12.9 % (ref 11.5–15.5)
WBC: 7 10*3/uL (ref 4.0–10.5)

## 2017-06-28 LAB — TSH: TSH: 1.01 u[IU]/mL (ref 0.35–4.50)

## 2017-06-28 LAB — SEDIMENTATION RATE: Sed Rate: 7 mm/hr (ref 0–30)

## 2017-06-28 MED ORDER — PREDNISONE 20 MG PO TABS: 40.0000 mg | ORAL_TABLET | Freq: Every day | ORAL | 0 refills | Status: AC

## 2017-06-28 NOTE — Progress Notes (Signed)
Chief Complaint  Patient presents with  . Ankle Pain    and swelling in (B)-started with pain on (7/28)-swelling started this pass weekend    Misty [name] here for bilateral leg swelling.  Duration: 12 day for pain, swelling started 5-6 days ago bitten by bug around 7/16 Hx of prolonged bedrest, recent surgery, travel or injury? No Pain the calf? Yes SOB? No Personal or family history of clot or bleeding disorder? No Hx of heart failure, renal failure, hepatic failure? No  She has associated bilateral ankle pain, worse in the right-there is no injury or increase in activity. She has been using ice and anti-inflammatories with some relief. The pain tends to get worse throughout the day.   ROS:  MSK- +leg swelling, no pain Lungs- no SOB  Past Medical History:  Diagnosis Date  . Acne    from IUD/hormonal replacement  . Allergy    seasonal  . Anxiety and depression   . Asthma   . Esophageal ulcer    by EGD 08-03-2014  . GERD (gastroesophageal reflux disease)   . IUD (intrauterine device) in place   . Lactose intolerance   . Nonspecific abnormal results of thyroid function study   . Osteoarthritis    Dr.Collins   . Other and unspecified hyperlipidemia    Family History  Problem Relation Age of Onset  . COPD Mother        smoker  . Heart disease Mother        stent @ 17; ablation for tachycardia  . Heart attack Father 61  . Lung disease Father        mesothelioma  . Hypertension Father   . Hyperlipidemia Sister   . Diabetes Maternal Aunt   . Lung cancer Maternal Grandmother   . Stroke Maternal Grandfather   . Breast cancer Paternal Grandmother   . Osteopenia Sister   . Heart attack Brother 51  . Esophageal cancer Neg Hx   . Stomach cancer Neg Hx   . Colon cancer Neg Hx    Past Surgical History:  Procedure Laterality Date  . BILATERAL KNEE ARTHROSCOPY     Left: 11/10/2008, Right: 04/15/2009. Dr.Andy Collins  . G 1 P 1    . GANGLION CYST EXCISION     Right  Wrist   . SPIROMETRY  01/2016  . TONSILLECTOMY AND ADENOIDECTOMY      Current Outpatient Prescriptions:  .  albuterol (PROAIR HFA) 108 (90 BASE) MCG/ACT inhaler, Inhale into the lungs every 6 (six) hours as needed for wheezing or shortness of breath., Disp: , Rfl:  .  aspirin 81 MG tablet, Take 81 mg by mouth daily. Reported on 02/23/2016, Disp: , Rfl:  .  atorvastatin (LIPITOR) 10 MG tablet, Take 1 tablet (10 mg total) by mouth daily., Disp: 90 tablet, Rfl: 3 .  BIOTIN PO, Take 1 tablet by mouth daily. , Disp: , Rfl:  .  budesonide-formoterol (SYMBICORT) 80-4.5 MCG/ACT inhaler, Inhale 2 puffs into the lungs 2 (two) times daily., Disp: , Rfl:  .  Calcium-Magnesium-Vitamin D (CALCIUM MAGNESIUM PO), Take 1 tablet by mouth daily. , Disp: , Rfl:  .  Cyanocobalamin (B-12 PO), Take 1 tablet by mouth daily. , Disp: , Rfl:  .  Desloratadine (CLARINEX PO), Take by mouth as needed., Disp: , Rfl:  .  GLUCOSAMINE SULFATE PO, Take 1 tablet by mouth daily. , Disp: , Rfl:  .  Iodine, Kelp, (KELP PO), Take 1 tablet by mouth daily. , Disp: ,  Rfl:  .  levonorgestrel (MIRENA) 20 MCG/24HR IUD, 1 each by Intrauterine route once., Disp: , Rfl:  .  Misc Natural Products (ADRENAL PO), Take 1 tablet by mouth daily. , Disp: , Rfl:  .  montelukast (SINGULAIR) 10 MG tablet, Take 10 mg by mouth at bedtime., Disp: , Rfl:  .  Omega-3 Fatty Acids (FISH OIL PO), Take 1 tablet by mouth daily. , Disp: , Rfl:  .  pantoprazole (PROTONIX) 40 MG tablet, Take 1 tablet 30 minutes before your breakfast and 30 minutes before your supper for 8 weeks, Disp: 60 tablet, Rfl: 3 .  SELENIUM PO, Take 1 tablet by mouth daily. , Disp: , Rfl:  .  spironolactone (ALDACTONE) 50 MG tablet, Take 50 mg by mouth daily., Disp: , Rfl:  .  TURMERIC PO, Take 1 tablet by mouth daily. , Disp: , Rfl:  .  venlafaxine XR (EFFEXOR-XR) 75 MG 24 hr capsule, Take 1 capsule (75 mg total) by mouth daily with breakfast., Disp: 90 capsule, Rfl: 3 .  predniSONE  (DELTASONE) 20 MG tablet, Take 2 tablets (40 mg total) by mouth daily with breakfast., Disp: 10 tablet, Rfl: 0  BP 130/86 (BP Location: Left Arm, Patient Position: Sitting, Cuff Size: Normal)   Pulse 83   Temp 98.2 F (36.8 C) (Oral)   Ht _0  (1.676 m)   Wt 194 lb (88 kg)   LMP 06/18/2017 (Exact Date)   SpO2 99%   BMI 31.31 kg/m  Gen- awake, alert, appears stated age Mouth- MMM, no ulcers/lesions noted Heart- RRR, no murmurs, 2+ pitting edema up tp knees b/l Lungs- CTAB, normal effort w/o accessory muscle use MSK- +lateral calf pain b/l; 5/5 strength throughout, gait normal, +TTP over ATFL on R, some edema around b/l ankles worse on R Psych: Age appropriate judgment and insight  Bilateral leg edema - Plan: TSH, Comprehensive metabolic panel  Acute bilateral ankle pain - Plan: CBC, ANA,IFA RA Diag Pnl w/rflx Tit/Patn, Sed Rate (ESR)  Myalgia - Plan: CBC, ANA,IFA RA Diag Pnl w/rflx Tit/Patn, Sed Rate (ESR), Aldolase, predniSONE (DELTASONE) 20 MG tablet  Orders as above. Hopefully this is related to her recent weight gain and lack of physical activity/exercise. Given her family history of autoimmune disorders, will also check the above labs. She has no history of heart failure and her exam is not suggestive of this either. I would like her to start exercising again. She has no risk factors for a blood clot. The bilateral presentation makes this even less likely. Ice. Tylenol. Hold off on NSAIDs while on steroids. Elevate legs. Mind salt intake.  F/u with Dr Larose Kells as originally scheduled or prn. Pt voiced understanding and agreement to the plan.  Mc[place], DO 06/28/17  9:15 AM

## 2017-06-28 NOTE — Patient Instructions (Signed)
Give [place] 4-5 business days to get the results of your labs back.   OK to take Tylenol 1000 mg (2 extra strength tabs) or 975 mg (3 regular strength tabs) every 6 hours as needed.  Ice/cold pack over area for 10-15 min every 2-3 hours while awake.  I think getting back to your exercise regimen is a reasonable decision.  Keep elevating your legs.   Watch your salt intake.   Let us know if you need anything.

## 2017-06-29 LAB — ANTI-NUCLEAR AB-TITER (ANA TITER)

## 2017-06-29 LAB — ANA,IFA RA DIAG PNL W/RFLX TIT/PATN
ANA: POSITIVE — AB
Cyclic Citrullin Peptide Ab: <16 Units
Rhuematoid fact SerPl-aCnc: <14 IU/mL (ref <14)

## 2017-07-02 LAB — ALDOLASE: Aldolase: 3.6 U/L (ref <=8.1)

## 2017-07-12 ENCOUNTER — Encounter: Payer: Self-pay | Admitting: Internal Medicine

## 2017-07-12 ENCOUNTER — Ambulatory Visit (INDEPENDENT_AMBULATORY_CARE_PROVIDER_SITE_OTHER): Payer: BLUE CROSS/BLUE SHIELD | Admitting: Internal Medicine

## 2017-07-12 VITALS — BP 116/76 | HR 89 | Temp 98.0°F | Resp 14 | Ht 66.0 in | Wt 193.5 lb

## 2017-07-12 DIAGNOSIS — K920 Hematemesis: Secondary | ICD-10-CM

## 2017-07-12 DIAGNOSIS — Z1211 Encounter for screening for malignant neoplasm of colon: Secondary | ICD-10-CM

## 2017-07-12 DIAGNOSIS — R079 Chest pain, unspecified: Secondary | ICD-10-CM

## 2017-07-12 DIAGNOSIS — R1310 Dysphagia, unspecified: Secondary | ICD-10-CM

## 2017-07-12 DIAGNOSIS — R1319 Other dysphagia: Secondary | ICD-10-CM

## 2017-07-12 DIAGNOSIS — Z0000 Encounter for general adult medical examination without abnormal findings: Secondary | ICD-10-CM | POA: Diagnosis not present

## 2017-07-12 DIAGNOSIS — M130 Polyarthritis, unspecified: Secondary | ICD-10-CM

## 2017-07-12 MED ORDER — PANTOPRAZOLE SODIUM 40 MG PO TBEC: 40.0000 mg | DELAYED_RELEASE_TABLET | Freq: Two times a day (BID) | ORAL | 3 refills | Status: DC

## 2017-07-12 MED ORDER — VENLAFAXINE HCL ER 75 MG PO CP24: 75.0000 mg | ORAL_CAPSULE | Freq: Every day | ORAL | 0 refills | Status: DC

## 2017-07-12 NOTE — Progress Notes (Signed)
Subjective:    Patient ID: Misty [name], female    DOB: 1966-06-08, 51 y.o.   MRN: [id number]  DOS:  07/12/2017 Type of visit - description : cpx Interval history: Here for a CPX. She is also having other issues: On July 30 developed pain at both ankles that spread upward. During several days she has generalized symmetric pain " on every joint , even on the scalp". At the time she had no respiratory symptoms, visual disturbances, rash; no  tick bites. Question of a fever x one day. She came to see Dr. Nani Ravens, labs were positive only for a ANA. She definitely improved with a round of prednisone. After she finished the prednisone, the symptoms resurface and  she still has some pain. The most affected joints were the ankles, they look swollen, slightly warm but not red.   Review of Systems   Other than above, a 14 point review of systems is negative    Past Medical History:  Diagnosis Date  . Acne    from IUD/hormonal replacement  . Allergy    seasonal  . Anxiety and depression   . Asthma   . Esophageal ulcer    by EGD 08-03-2014  . GERD (gastroesophageal reflux disease)   . IUD (intrauterine device) in place   . Lactose intolerance   . Nonspecific abnormal results of thyroid function study   . Osteoarthritis    Dr.Collins   . Other and unspecified hyperlipidemia     Past Surgical History:  Procedure Laterality Date  . BILATERAL KNEE ARTHROSCOPY     Left: 11/10/2008, Right: 04/15/2009. Dr.Andy Collins  . G 1 P 1    . GANGLION CYST EXCISION     Right Wrist   . SPIROMETRY  01/2016  . TONSILLECTOMY AND ADENOIDECTOMY      Social History   Social History  . Marital status: Significant Other    Spouse name: N/A  . Number of children: 1  . Years of education: N/A   Occupational History  . [employer], [employer]ment    Social History Main Topics  . Smoking status: Never Smoker  . Smokeless tobacco: Never Used  . Alcohol use 1.2 oz/week    2 Glasses of  wine per week     Comment: Socially  . Drug use: No  . Sexual activity: Not on file   Other Topics Concern  . Not on file   Social History Narrative   Household-- pt, boyfriend  and  Child 2003   divorced     Family History  Problem Relation Age of Onset  . COPD Mother        smoker  . Heart disease Mother        stent @ 53; ablation for tachycardia  . Heart attack Father 4  . Lung disease Father        mesothelioma  . Hypertension Father   . Hyperlipidemia Sister   . Rheum arthritis Sister   . Diabetes Maternal Aunt   . Lung cancer Maternal Grandmother   . Stroke Maternal Grandfather   . Breast cancer Paternal Grandmother   . Osteopenia Sister   . Fibromyalgia Sister   . Heart attack Brother 16  . Esophageal cancer Neg Hx   . Stomach cancer Neg Hx   . Colon cancer Neg Hx     Allergies as of 07/12/2017      Reactions   Lactose Intolerance (gi) Other (See Comments)   GI side  effects   Penicillins    Unspecified reaction as an infant      Medication List       Accurate as of 07/12/17  7:23 PM. Always use your most recent med list.          ADRENAL PO Take 1 tablet by mouth daily.   aspirin 81 MG tablet Take 81 mg by mouth daily. Reported on 02/23/2016   atorvastatin 10 MG tablet Commonly known as:  LIPITOR Take 1 tablet (10 mg total) by mouth daily.   B-12 PO Take 1 tablet by mouth daily.   BIOTIN PO Take 1 tablet by mouth daily.   budesonide-formoterol 80-4.5 MCG/ACT inhaler Commonly known as:  SYMBICORT Inhale 2 puffs into the lungs 2 (two) times daily.   CALCIUM MAGNESIUM PO Take 1 tablet by mouth daily.   CLARINEX PO Take by mouth as needed.   FISH OIL PO Take 1 tablet by mouth daily.   GLUCOSAMINE SULFATE PO Take 1 tablet by mouth daily.   KELP PO Take 1 tablet by mouth daily.   levonorgestrel 20 MCG/24HR IUD Commonly known as:  MIRENA 1 each by Intrauterine route once.   montelukast 10 MG tablet Commonly known as:   SINGULAIR Take 10 mg by mouth at bedtime.   pantoprazole 40 MG tablet Commonly known as:  PROTONIX Take 1 tablet (40 mg total) by mouth 2 (two) times daily before a meal.   PROAIR HFA 108 (90 Base) MCG/ACT inhaler Generic drug:  albuterol Inhale into the lungs every 6 (six) hours as needed for wheezing or shortness of breath.   SELENIUM PO Take 1 tablet by mouth daily.   spironolactone 50 MG tablet Commonly known as:  ALDACTONE Take 50 mg by mouth daily.   TURMERIC PO Take 1 tablet by mouth daily.   venlafaxine XR 75 MG 24 hr capsule Commonly known as:  EFFEXOR-XR Take 1 capsule (75 mg total) by mouth daily with breakfast.            Discharge Care Instructions        Start     Ordered   07/12/17 0000  Lipid panel     07/12/17 1552   07/12/17 0000  CBC w/Diff     07/12/17 1552   07/12/17 0000  Sed Rate (ESR)     07/12/17 1552   07/12/17 0000  Iron     07/12/17 1552   07/12/17 0000  Ferritin     07/12/17 1552   07/12/17 0000  CK (Creatine Kinase)     07/12/17 1552   07/12/17 0000  Antinuclear Antib (ANA)     07/12/17 1552   07/12/17 0000  venlafaxine XR (EFFEXOR-XR) 75 MG 24 hr capsule  Daily with breakfast     07/12/17 1554   07/12/17 0000  pantoprazole (PROTONIX) 40 MG tablet  2 times daily before meals     07/12/17 1555   07/12/17 0000  Ambulatory referral to Gastroenterology    Question:  What is the reason for referral?  Answer:  Colonoscopy   07/12/17 1557   07/12/17 0000  Ambulatory referral to Rheumatology     07/12/17 1557         Objective:   Physical Exam BP 116/76 (BP Location: Left Arm, Patient Position: Sitting, Cuff Size: Normal)   Pulse 89   Temp 98 F (36.7 C) (Oral)   Resp 14   Ht 5' 6"  (1.676 m)   Wt 193 lb 8 oz (87.8  kg)   LMP 06/18/2017 (Exact Date)   SpO2 98%   BMI 31.23 kg/m   General:   Well developed, well nourished . NAD.  Neck: No  thyromegaly  HEENT:  Normocephalic . Face symmetric, atraumatic Lungs:  CTA  B Normal respiratory effort, no intercostal retractions, no accessory muscle use. Heart: RRR,  no murmur.  No pretibial edema bilaterally  Abdomen:  Not distended, soft, non-tender. No rebound or rigidity.   MSK: Hands, wrists, elbows, knees: No synovitis on exam Right ankle normal Left ankle: Mild puffiness under the external malleolus. No red or TTP. Skin: Exposed areas without rash. Not pale. Not jaundice Neurologic:  alert & oriented X3.  Speech normal, gait appropriate for age and unassisted Strength symmetric and appropriate for age.  Psych: Cognition and judgment appear intact.  Cooperative with normal attention span and concentration.  Behavior appropriate. No anxious or depressed appearing.    Assessment & Plan:   Assessment Hyperlipidemia + FH CAD Anxiety, depression Acne: From IUD, HRT, on Aldactone Allergies, asthma: Dr. Donneta Romberg DJD GERD, esophageal ulcer 2015 Birth control: IUD  PLAN: Symmetric polyarthralgia: As described above, workup negative except for + ANA (weak), responded temporarily to steroids. Symptoms are currently mild ; has a family history of autoimmune diseases. Plan: Check a CK, ANA, sedimentation rate. Refer to rheumatology. Mild anemia: Found in recent labs, will repeat the CBC, check iron and ferritin. Other problem seems to be stable. RTC 3 months.

## 2017-07-12 NOTE — Assessment & Plan Note (Signed)
Symmetric polyarthralgia: As described above, workup negative except for + ANA (weak), responded temporarily to steroids. Symptoms are currently mild ; has a family history of autoimmune diseases. Plan: Check a CK, ANA, sedimentation rate. Refer to rheumatology. Mild anemia: Found in recent labs, will repeat the CBC, check iron and ferritin. Other problem seems to be stable. RTC 3 months.

## 2017-07-12 NOTE — Patient Instructions (Signed)
  GO TO THE FRONT DESK Schedule your next appointment for a  Check up in 3 months   Schedule labs to be done within few days, fasting

## 2017-07-12 NOTE — Assessment & Plan Note (Addendum)
-   Td 2010;  Pneumovax  2017 -Female care - per gyn, Dr Gaetano Net;  PAP abnormal, had a cervical bx last week, results pending;  normal MMG @ gyn  -CCS :Never had a cscope. Options discussed, elected GI referral. -Labs:  Will come back fasting for a FLP, CBC, iron, ferritin , total CK, ANA, sedimentation rate.

## 2017-07-12 NOTE — Progress Notes (Signed)
Pre visit review using our clinic review tool, if applicable. No additional management support is needed unless otherwise documented below in the visit note. 

## 2017-07-19 ENCOUNTER — Other Ambulatory Visit (INDEPENDENT_AMBULATORY_CARE_PROVIDER_SITE_OTHER): Payer: BLUE CROSS/BLUE SHIELD

## 2017-07-19 DIAGNOSIS — Z0000 Encounter for general adult medical examination without abnormal findings: Secondary | ICD-10-CM | POA: Diagnosis not present

## 2017-07-19 DIAGNOSIS — M130 Polyarthritis, unspecified: Secondary | ICD-10-CM | POA: Diagnosis not present

## 2017-07-19 LAB — CBC WITH DIFFERENTIAL/PLATELET
BASOS PCT: 0.9 % (ref 0.0–3.0)
Basophils Absolute: 0.1 10*3/uL (ref 0.0–0.1)
EOS ABS: 0.2 10*3/uL (ref 0.0–0.7)
EOS PCT: 3.4 % (ref 0.0–5.0)
HCT: 36.6 % (ref 36.0–46.0)
HEMOGLOBIN: 12.1 g/dL (ref 12.0–15.0)
Lymphocytes Relative: 16.7 % (ref 12.0–46.0)
Lymphs Abs: 1.1 10*3/uL (ref 0.7–4.0)
MCHC: 33.2 g/dL (ref 30.0–36.0)
MCV: 96.8 fl (ref 78.0–100.0)
MONO ABS: 0.4 10*3/uL (ref 0.1–1.0)
Monocytes Relative: 6.4 % (ref 3.0–12.0)
NEUTROS ABS: 4.9 10*3/uL (ref 1.4–7.7)
Neutrophils Relative %: 72.6 % (ref 43.0–77.0)
PLATELETS: 329 10*3/uL (ref 150.0–400.0)
RBC: 3.77 Mil/uL — ABNORMAL LOW (ref 3.87–5.11)
RDW: 13.1 % (ref 11.5–15.5)
WBC: 6.8 10*3/uL (ref 4.0–10.5)

## 2017-07-19 LAB — FERRITIN: FERRITIN: 126.9 ng/mL (ref 10.0–291.0)

## 2017-07-19 LAB — IRON: IRON: 43 ug/dL (ref 42–145)

## 2017-07-19 LAB — LIPID PANEL
CHOLESTEROL: 161 mg/dL (ref 0–200)
HDL: 56.8 mg/dL (ref >39.00)
LDL CALC: 90 mg/dL (ref 0–99)
NonHDL: 103.78
TRIGLYCERIDES: 69 mg/dL (ref 0.0–149.0)
Total CHOL/HDL Ratio: 3
VLDL: 13.8 mg/dL (ref 0.0–40.0)

## 2017-07-19 LAB — SEDIMENTATION RATE: SED RATE: 12 mm/h (ref 0–30)

## 2017-07-19 LAB — CK: Total CK: 20 U/L (ref 7–177)

## 2017-07-20 LAB — ANTI-NUCLEAR AB-TITER (ANA TITER)

## 2017-07-20 LAB — ANA: Anti Nuclear Antibody(ANA): POSITIVE — AB

## 2017-07-30 ENCOUNTER — Ambulatory Visit (AMBULATORY_SURGERY_CENTER): Payer: Self-pay

## 2017-07-30 VITALS — Ht 65.5 in | Wt 192.8 lb

## 2017-07-30 DIAGNOSIS — Z1211 Encounter for screening for malignant neoplasm of colon: Secondary | ICD-10-CM

## 2017-07-30 MED ORDER — NA SULFATE-K SULFATE-MG SULF 17.5-3.13-1.6 GM/177ML PO SOLN: 1.0000 | Freq: Once | ORAL | 0 refills | Status: AC

## 2017-07-30 NOTE — Progress Notes (Signed)
Denies allergies to eggs or soy products. Denies complication of anesthesia or sedation. Denies use of weight loss medication. Denies use of O2.   Emmi instructions given for colonoscopy.  

## 2017-07-31 ENCOUNTER — Telehealth: Payer: Self-pay | Admitting: Internal Medicine

## 2017-07-31 MED ORDER — VENLAFAXINE HCL ER 75 MG PO CP24: 75.0000 mg | ORAL_CAPSULE | Freq: Every day | ORAL | 3 refills | Status: DC

## 2017-07-31 MED ORDER — ATORVASTATIN CALCIUM 10 MG PO TABS: 10.0000 mg | ORAL_TABLET | Freq: Every day | ORAL | 3 refills | Status: DC

## 2017-07-31 NOTE — Telephone Encounter (Signed)
Please inform Pt that requested Rx's have been sent. #90 and 3 refills. Thank you.

## 2017-07-31 NOTE — Telephone Encounter (Signed)
lipitor and Effexor to [facility] three month supply. Call pt to confirm please.

## 2017-08-16 ENCOUNTER — Encounter: Payer: Self-pay | Admitting: Internal Medicine

## 2017-08-23 ENCOUNTER — Ambulatory Visit (AMBULATORY_SURGERY_CENTER): Payer: BLUE CROSS/BLUE SHIELD | Admitting: Internal Medicine

## 2017-08-23 ENCOUNTER — Encounter: Payer: Self-pay | Admitting: Internal Medicine

## 2017-08-23 VITALS — BP 134/79 | HR 76 | Temp 97.5°F | Resp 13 | Ht 66.0 in | Wt 193.0 lb

## 2017-08-23 DIAGNOSIS — Z1212 Encounter for screening for malignant neoplasm of rectum: Secondary | ICD-10-CM

## 2017-08-23 DIAGNOSIS — Z1211 Encounter for screening for malignant neoplasm of colon: Secondary | ICD-10-CM

## 2017-08-23 DIAGNOSIS — D122 Benign neoplasm of ascending colon: Secondary | ICD-10-CM | POA: Diagnosis not present

## 2017-08-23 MED ORDER — SODIUM CHLORIDE 0.9 % IV SOLN: 500.0000 mL | INTRAVENOUS | Status: DC

## 2017-08-23 NOTE — Progress Notes (Signed)
Report to PACU, RN, vss, BBS= Clear.  

## 2017-08-23 NOTE — Op Note (Signed)
[name]: Misty [name] Procedure Date: 08/23/2017 8:40 AM MRN: [id number] Endoscopist: Jerene Bears , MD Age: 51 Referring MD:  Date of Birth: September 12, 1966 Gender: Female Account #: [id number] Procedure:                Colonoscopy Indications:              Screening for colorectal malignant neoplasm, This                            is the patient's first colonoscopy Medicines:                Monitored Anesthesia Care Procedure:                Pre-Anesthesia Assessment:                           - Prior to the procedure, a History and Physical                            was performed, and patient medications and                            allergies were reviewed. The patient's tolerance of                            previous anesthesia was also reviewed. The risks                            and benefits of the procedure and the sedation                            options and risks were discussed with the patient.                            All questions were answered, and informed consent                            was obtained. Prior Anticoagulants: The patient has                            taken no previous anticoagulant or antiplatelet                            agents. ASA Grade Assessment: II - A patient with                            mild systemic disease. After reviewing the risks                            and benefits, the patient was deemed in                            satisfactory condition to undergo the procedure.  After obtaining informed consent, the colonoscope                            was passed under direct vision. Throughout the                            procedure, the patient's blood pressure, pulse, and                            oxygen saturations were monitored continuously. The                            Colonoscope was introduced through the anus and                            advanced to the the cecum,  identified by                            appendiceal orifice and ileocecal valve. The                            colonoscopy was performed without difficulty. The                            patient tolerated the procedure well. The quality                            of the bowel preparation was good. The ileocecal                            valve, appendiceal orifice, and rectum were                            photographed. Scope In: 8:49:07 AM Scope Out: 9:03:22 AM Scope Withdrawal Time: 0 hours 9 minutes 54 seconds  Total Procedure Duration: 0 hours 14 minutes 15 seconds  Findings:                 The digital rectal exam was normal.                           A 7 mm polyp was found in the ascending colon. The                            polyp was flat. The polyp was removed with a cold                            snare. Resection and retrieval were complete.                           Multiple small-mouthed diverticula were found in                            the sigmoid colon, ascending colon and cecum.  The exam was otherwise without abnormality on                            direct and retroflexion views. Complications:            No immediate complications. Estimated Blood Loss:     Estimated blood loss was minimal. Impression:               - One 7 mm polyp in the ascending colon, removed                            with a cold snare. Resected and retrieved.                           - Mild diverticulosis in the sigmoid colon, in the                            ascending colon and in the cecum.                           - The examination was otherwise normal on direct                            and retroflexion views. Recommendation:           - Patient has a contact number available for                            emergencies. The signs and symptoms of potential                            delayed complications were discussed with the                             patient. Return to normal activities tomorrow.                            Written discharge instructions were provided to the                            patient.                           - Resume previous diet.                           - Continue present medications.                           - Await pathology results.                           - Repeat colonoscopy is recommended. The                            colonoscopy date will be determined after pathology  results from today's exam become available for                            review. Jerene Bears, MD 08/23/2017 9:05:56 AM This report has been signed electronically.

## 2017-08-23 NOTE — Patient Instructions (Signed)
Discharge instructions given. Handouts on polyps and diverticulosis. Resume previous medications. YOU HAD AN ENDOSCOPIC PROCEDURE TODAY AT THE [facility]:   Refer to the procedure report that was given to you for any specific questions about what was found during the examination.  If the procedure report does not answer your questions, please call your gastroenterologist to clarify.  If you requested that your care partner not be given the details of your procedure findings, then the procedure report has been included in a sealed envelope for you to review at your convenience later.  YOU SHOULD EXPECT: Some feelings of bloating in the abdomen. Passage of more gas than usual.  Walking can help get rid of the air that was put into your GI tract during the procedure and reduce the bloating. If you had a lower endoscopy (such as a colonoscopy or flexible sigmoidoscopy) you may notice spotting of blood in your stool or on the toilet paper. If you underwent a bowel prep for your procedure, you may not have a normal bowel movement for a few days.  Please Note:  You might notice some irritation and congestion in your nose or some drainage.  This is from the oxygen used during your procedure.  There is no need for concern and it should clear up in a day or so.  SYMPTOMS TO REPORT IMMEDIATELY:   Following lower endoscopy (colonoscopy or flexible sigmoidoscopy):  Excessive amounts of blood in the stool  Significant tenderness or worsening of abdominal pains  Swelling of the abdomen that is new, acute  Fever of 100F or higher   For urgent or emergent issues, a gastroenterologist can be reached at any hour by calling [phone].   DIET:  We do recommend a small meal at first, but then you may proceed to your regular diet.  Drink plenty of fluids but you should avoid alcoholic beverages for 24 hours.  ACTIVITY:  You should plan to take it easy for the rest of today and you should NOT  DRIVE or use heavy machinery until tomorrow (because of the sedation medicines used during the test).    FOLLOW UP: Our staff will call the number listed on your records the next business day following your procedure to check on you and address any questions or concerns that you may have regarding the information given to you following your procedure. If we do not reach you, we will leave a message.  However, if you are feeling well and you are not experiencing any problems, there is no need to return our call.  We will assume that you have returned to your regular daily activities without incident.  If any biopsies were taken you will be contacted by phone or by letter within the next 1-3 weeks.  Please call us at [phone] if you have not heard about the biopsies in 3 weeks.    SIGNATURES/CONFIDENTIALITY: You and/or your care partner have signed paperwork which will be entered into your electronic medical record.  These signatures attest to the fact that that the information above on your After Visit Summary has been reviewed and is understood.  Full responsibility of the confidentiality of this discharge information lies with you and/or your care-partner. 

## 2017-08-23 NOTE — Progress Notes (Signed)
Called to room to assist during endoscopic procedure.  Patient ID and intended procedure confirmed with present staff. Received instructions for my participation in the procedure from the performing physician.  

## 2017-08-24 ENCOUNTER — Telehealth: Payer: Self-pay | Admitting: *Deleted

## 2017-08-24 NOTE — Telephone Encounter (Signed)
  Follow up Call-  Call back number 08/23/2017  Post procedure Call Back phone  # [phone]  Permission to leave phone message Yes  Some recent data might be hidden     Patient questions:  Do you have a fever, pain , or abdominal swelling? No. Pain Score  0 *  Have you tolerated food without any problems? Yes.    Have you been able to return to your normal activities? Yes.    Do you have any questions about your discharge instructions: Diet   No. Medications  No. Follow up visit  No.  Do you have questions or concerns about your Care? No.  Actions: * If pain score is 4 or above: No action needed, pain <4.

## 2017-08-30 ENCOUNTER — Encounter: Payer: Self-pay | Admitting: Internal Medicine

## 2017-10-08 ENCOUNTER — Ambulatory Visit: Payer: BLUE CROSS/BLUE SHIELD | Admitting: Internal Medicine

## 2017-12-27 ENCOUNTER — Ambulatory Visit: Payer: Self-pay | Admitting: Rheumatology

## 2018-01-18 NOTE — Progress Notes (Signed)
Office Visit Note  Patient: Misty [name]             Date of Birth: February 19, 1966           MRN: [id number]             PCP: Colon Branch, MD Referring: Colon Branch, MD Visit Date: 01/31/2018 Occupation: Customer care supervisor    Subjective:  Positive ANA and raynaud's   History of Present Illness: Misty [name] is a 52 y.o. female seen in consultation per request of her PCP.  According to patient she had bilateral meniscal tear repair in 2009.  Over time she developed osteoarthritis in her knee joints.  She states her right knee joint osteoarthritis is much worse.  She has been having difficulty exercising due to knee joint pain.  She is also gained about 60 pounds since then.  She states that last year she started noticing increased swelling and discomfort in her feet and ankles.  At the time she was also experiencing hyperalgesia.  She was seen by her PCP and was given a prednisone taper for 1 week.  She noticed improvement in her pain symptoms but the swelling persist for a while.  She states she has had off-and-on discomfort in her elbows, wrists, hands.  Her  right hand especially is more painful with increased activities.  She also has ongoing discomfort in her knee joints she has occasional discomfort in her ankles and feet now.  She recalls having episodes of plantar fasciitis in the past.  She also has calluses on the bottom of her feet.  She denies any history of psoriasis.  She states her symptoms of joint pain is started after an insect bite last summer.  The symptoms improved after August 2018.  She has some arthralgias but no joint swelling now.  This winter she had problems with Raynauds in her left first and second digit.  Activities of Daily Living:  Patient reports morning stiffness for 15 minutes.   Patient Denies nocturnal pain.  Difficulty dressing/grooming: Denies Difficulty climbing stairs: Reports Difficulty getting out of chair: Reports Difficulty using hands for  taps, buttons, cutlery, and/or writing: Denies   Review of Systems  Constitutional: Positive for fatigue. Negative for weakness.  HENT: Negative for mouth sores, trouble swallowing, trouble swallowing, mouth dryness and nose dryness.   Eyes: Positive for dryness. Negative for redness and visual disturbance.  Respiratory: Positive for shortness of breath (hx of asthma). Negative for cough, hemoptysis and difficulty breathing.   Cardiovascular: Negative for chest pain, palpitations, hypertension, irregular heartbeat and swelling in legs/feet.  Gastrointestinal: Negative for blood in stool, constipation and diarrhea.  Endocrine: Negative for increased urination.  Genitourinary: Negative for painful urination.  Musculoskeletal: Positive for arthralgias, joint pain, joint swelling and morning stiffness. Negative for myalgias, muscle weakness, muscle tenderness and myalgias.  Skin: Positive for color change and redness (Rosacea). Negative for rash, hair loss, nodules/bumps, skin tightness, ulcers and sensitivity to sunlight.  Allergic/Immunologic: Negative for susceptible to infections.  Neurological: Negative for dizziness, numbness and headaches.  Hematological: Negative for swollen glands.  Psychiatric/Behavioral: Positive for depressed mood and sleep disturbance. The patient is nervous/anxious.     PMFS History:  Patient Active Problem List   Diagnosis Date Noted  . PCP NOTES >>>>>>>>>>>>>>>>>>>>>>>>>>>>>>>> 02/24/2016  . Annual physical exam 04/12/2015  . FH: CAD (coronary artery disease) 08/25/2014  . Esophageal ulcer 08/03/2014  . *Depression 05/12/2009  . ECZEMA 05/12/2009  . OSTEOARTHRITIS 05/12/2009  . *  Hyperlipidemia 03/26/2008  . 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 LACTOSE INTOLERANCE 03/24/2008  . Asthma, f/u by her allergist  03/24/2008  . 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 Acne    from IUD/hormonal replacement  . Allergy     seasonal  . Anxiety   . Anxiety and depression   . Asthma   . Depression   . Esophageal ulcer    by EGD 08-03-2014  . GERD (gastroesophageal reflux disease)   . IUD (intrauterine device) in place   . Lactose intolerance   . Nonspecific abnormal results of thyroid function study   . Osteoarthritis    Dr.Collins   . Other and unspecified hyperlipidemia   . Thyroid disease     Family History  Problem Relation Age of Onset  . COPD Mother        smoker  . Heart disease Mother        stent @ 57; ablation for tachycardia  . Heart attack Father 63  . Lung disease Father        mesothelioma  . Hypertension Father   . Hyperlipidemia Sister   . Rheum arthritis Sister   . Diabetes Maternal Aunt   . Lung cancer Maternal Grandmother   . Stroke Maternal Grandfather   . Breast cancer Paternal Grandmother   . Osteopenia Sister   . Fibromyalgia Sister   . Heart attack Brother 48  . Esophageal cancer Neg Hx   . Stomach cancer Neg Hx   . Colon cancer Neg Hx   . Rectal cancer Neg Hx    Past Surgical History:  Procedure Laterality Date  . BILATERAL KNEE ARTHROSCOPY     Left: 11/10/2008, Right: 04/15/2009. Dr.Andy Collins  . G 1 P 1    . GANGLION CYST EXCISION     Right Wrist   . SPIROMETRY  01/2016  . TONSILLECTOMY AND ADENOIDECTOMY     Social History   Social History Narrative   Household-- pt, boyfriend  and  Child 2003   divorced     Objective: Vital Signs: BP 140/86 (BP Location: Left Arm, Patient Position: Sitting, Cuff Size: Small)   Pulse 81   Resp 13   Ht 5' 5.5" (1.664 m)   Wt 209 lb (94.8 kg)   LMP 01/18/2018   BMI 34.25 kg/m    Physical Exam  Constitutional: She is oriented to person, place, and time. She appears well-developed and well-nourished.  HENT:  Head: Normocephalic and atraumatic.  Eyes: Conjunctivae and EOM are normal.  Neck: Normal range of motion.  Cardiovascular: Normal rate, regular rhythm, normal heart sounds and intact distal pulses.    Pulmonary/Chest: Effort normal and breath sounds normal.  Abdominal: Soft. Bowel sounds are normal.  Lymphadenopathy:    She has no cervical adenopathy.  Neurological: She is alert and oriented to person, place, and time.  Skin: Skin is warm and dry. Capillary refill takes less than 2 seconds.  Psychiatric: She has a normal mood and affect. Her behavior is normal.  Nursing note and vitals reviewed.    Musculoskeletal Exam: C-spine thoracic lumbar spine good range of motion.  Shoulder joints elbow joints wrist joints with good range of motion.  No MCP PIP swelling or synovitis was noted.  She has warmth and swelling in her right knee joint.  She is crepitus with range of motion of bilateral knee joints.  There was no swelling or ankle joints MCPs or PIPs today.  CDAI  Exam: No CDAI exam completed.    Investigation: Findings:  January 09, 2018 T4 normal TSH normal anti-tTG negative ANA 1: 320 homogeneous pattern ENA including RNP, Smith, SCL 70, SSA, SSB, Jo 1, centromere negative   Component     Latest Ref Rng & Units 07/19/2017  ANA Pattern 1      HOMOGENEOUS (A)  ANA Titer 1     titer 1:160 (H)  Sed Rate     0 - 30 mm/hr 12  Iron     42 - 145 ug/dL 43  Ferritin     10.0 - 291.0 ng/mL 126.9  CK Total     7 - 177 U/L 20  Anit Nuclear Antibody(ANA)     NEGATIVE POS (A)   CBC Latest Ref Rng & Units 07/19/2017 06/28/2017 06/22/2016  WBC 4.0 - 10.5 K/uL 6.8 7.0 6.1  Hemoglobin 12.0 - 15.0 g/dL 12.1 11.9(L) 13.1  Hematocrit 36.0 - 46.0 % 36.6 35.3(L) 38.0  Platelets 150.0 - 400.0 K/uL 329.0 279.0 235.0   CMP Latest Ref Rng & Units 06/28/2017 06/22/2016 04/12/2015  Glucose 70 - 99 mg/dL 105(H) - 97  BUN 6 - 23 mg/dL 11 - 8  Creatinine 0.40 - 1.20 mg/dL 0.65 - 0.66  Sodium 135 - 145 mEq/L 139 - 137  Potassium 3.5 - 5.1 mEq/L 4.1 - 3.9  Chloride 96 - 112 mEq/L 105 - 104  CO2 19 - 32 mEq/L 29 - 25  Calcium 8.4 - 10.5 mg/dL 8.7 - 9.0  Total Protein 6.0 - 8.3 g/dL 6.8 - -  Total  Bilirubin 0.2 - 1.2 mg/dL 0.5 - -  Alkaline Phos 39 - 117 U/L 64 - -  AST 0 - 37 U/L 9 13 25   ALT 0 - 35 U/L 10 13 15     Imaging: Xr Knee 3 View Left  Result Date: 01/31/2018 Moderate medial compartment narrowing was noted.  No chondrocalcinosis was noted.  Moderate patellofemoral narrowing was noted. Impression: These findings are consistent with moderate osteoarthritis and moderate chondromalacia patella.  Xr Knee 3 View Right  Result Date: 01/31/2018 Moderate to severe medial compartment narrowing with medial osteophytes and intercondylar and lateral osteophytes were noted.  No chondrocalcinosis was noted.  Severe patellofemoral narrowing was noted. Impression: These findings are consistent with moderate to severe osteoarthritis and severe chondromalacia patella.   Speciality Comments: No specialty comments available.    Procedures:  Large Joint Inj: R knee on 01/31/2018 10:51 AM Indications: pain Details: 27 G 1.5 in needle, medial approach  Arthrogram: No  Medications: 1.5 mL lidocaine (PF) 1 %; 40 mg triamcinolone acetonide 40 MG/ML Aspirate: 0 mL Outcome: tolerated well, no immediate complications Procedure, treatment alternatives, risks and benefits explained, specific risks discussed. Consent was given by the patient. Immediately prior to procedure a time out was called to verify the correct patient, procedure, equipment, support staff and site/side marked as required. Patient was prepped and draped in the usual sterile fashion.     Allergies: Lactose intolerance (gi); Latex; and Penicillins   Assessment / Plan:     Visit Diagnoses: Polyarthralgia -patient gives history of intermittent swelling and pain in her joints.  She states the symptoms have not been very active currently.  To complete the workup I will obtain following labs today.  Plan: Rheumatoid factor, Cyclic citrul peptide antibody, IgG  Positive ANA (antinuclear antibody) - 1:160 Homogeneous -her ENA panel  was negative.  She also gives history of Raynauds phenomenon.  I would obtain following  labs today.  He has no other clinical features of autoimmune disease.  Plan: C3 and C4, Beta-2 glycoprotein antibodies, Cardiolipin antibodies, IgG, IgM, IgA, Lupus Anticoagulant Eval w/Reflex, Urinalysis, Routine w reflex microscopic  Raynaud's disease without gangrene -gives history of Raynauds symptoms in her left first and second digit during the wintertime.  Plan: Pan-ANCA, Cryoglobulin  Chronic pain of both knees -she has discomfort in her bilateral knee joints.  She gives a history of osteoarthritis.  She had warmth and swelling in the right knee joint.  Plan: XR KNEE 3 VIEW RIGHT, XR KNEE 3 VIEW LEFT, Uric acid, Angiotensin converting enzyme, HLA-B27 antigen.  A handout on knee joint exercises was given..  AS per her request right knee joint was injected with cortisone.  Other fatigue - Plan: VITAMIN D 25 Hydroxy (Vit-D Deficiency, Fractures)  Mild anemia  History of gastroesophageal reflux (GERD)  Hx of esophageal ulcer  History of asthma  History of thyroid disease  History of eczema  History of rosacea  Hashimoto's thyroiditis: This could be contributing to her positive ANA.  Mixed hyperlipidemia  Anxiety and depression    Orders: Orders Placed This Encounter  Procedures  . XR KNEE 3 VIEW RIGHT  . XR KNEE 3 VIEW LEFT  . Rheumatoid factor  . Cyclic citrul peptide antibody, IgG  . C3 and C4  . Beta-2 glycoprotein antibodies  . Cardiolipin antibodies, IgG, IgM, IgA  . Lupus Anticoagulant Eval w/Reflex  . Pan-ANCA  . Cryoglobulin  . Urinalysis, Routine w reflex microscopic  . VITAMIN D 25 Hydroxy (Vit-D Deficiency, Fractures)  . Uric acid  . Angiotensin converting enzyme  . HLA-B27 antigen   No orders of the defined types were placed in this encounter.   Face-to-face time spent with patient was 45 minutes. . Greater than 50% of time was spent in counseling and coordination  of care.  Follow-Up Instructions: Return for Positive ANA, Raynauds.   Bo Merino, MD  Note - This record has been created using Editor, commissioning.  Chart creation errors have been sought, but may not always  have been located. Such creation errors do not reflect on  the standard of medical care.

## 2018-01-22 DIAGNOSIS — N926 Irregular menstruation, unspecified: Secondary | ICD-10-CM | POA: Diagnosis not present

## 2018-01-22 DIAGNOSIS — E063 Autoimmune thyroiditis: Secondary | ICD-10-CM | POA: Diagnosis not present

## 2018-01-22 DIAGNOSIS — R768 Other specified abnormal immunological findings in serum: Secondary | ICD-10-CM | POA: Diagnosis not present

## 2018-01-22 DIAGNOSIS — R5383 Other fatigue: Secondary | ICD-10-CM | POA: Diagnosis not present

## 2018-01-23 DIAGNOSIS — J3081 Allergic rhinitis due to animal (cat) (dog) hair and dander: Secondary | ICD-10-CM | POA: Diagnosis not present

## 2018-01-23 DIAGNOSIS — J301 Allergic rhinitis due to pollen: Secondary | ICD-10-CM | POA: Diagnosis not present

## 2018-01-23 DIAGNOSIS — J3089 Other allergic rhinitis: Secondary | ICD-10-CM | POA: Diagnosis not present

## 2018-01-24 ENCOUNTER — Ambulatory Visit: Payer: Self-pay | Admitting: Rheumatology

## 2018-01-29 DIAGNOSIS — E669 Obesity, unspecified: Secondary | ICD-10-CM | POA: Diagnosis not present

## 2018-01-29 DIAGNOSIS — Z713 Dietary counseling and surveillance: Secondary | ICD-10-CM | POA: Diagnosis not present

## 2018-01-30 DIAGNOSIS — J3081 Allergic rhinitis due to animal (cat) (dog) hair and dander: Secondary | ICD-10-CM | POA: Diagnosis not present

## 2018-01-30 DIAGNOSIS — J301 Allergic rhinitis due to pollen: Secondary | ICD-10-CM | POA: Diagnosis not present

## 2018-01-30 DIAGNOSIS — J3089 Other allergic rhinitis: Secondary | ICD-10-CM | POA: Diagnosis not present

## 2018-01-31 ENCOUNTER — Encounter: Payer: Self-pay | Admitting: Rheumatology

## 2018-01-31 ENCOUNTER — Ambulatory Visit: Payer: 59 | Admitting: Rheumatology

## 2018-01-31 ENCOUNTER — Ambulatory Visit (INDEPENDENT_AMBULATORY_CARE_PROVIDER_SITE_OTHER): Payer: Self-pay

## 2018-01-31 ENCOUNTER — Ambulatory Visit (INDEPENDENT_AMBULATORY_CARE_PROVIDER_SITE_OTHER): Payer: 59

## 2018-01-31 VITALS — BP 140/86 | HR 81 | Resp 13 | Ht 65.5 in | Wt 209.0 lb

## 2018-01-31 DIAGNOSIS — F329 Major depressive disorder, single episode, unspecified: Secondary | ICD-10-CM

## 2018-01-31 DIAGNOSIS — E063 Autoimmune thyroiditis: Secondary | ICD-10-CM

## 2018-01-31 DIAGNOSIS — M25562 Pain in left knee: Secondary | ICD-10-CM

## 2018-01-31 DIAGNOSIS — R5383 Other fatigue: Secondary | ICD-10-CM

## 2018-01-31 DIAGNOSIS — D649 Anemia, unspecified: Secondary | ICD-10-CM

## 2018-01-31 DIAGNOSIS — Z872 Personal history of diseases of the skin and subcutaneous tissue: Secondary | ICD-10-CM | POA: Diagnosis not present

## 2018-01-31 DIAGNOSIS — E782 Mixed hyperlipidemia: Secondary | ICD-10-CM | POA: Diagnosis not present

## 2018-01-31 DIAGNOSIS — Z8709 Personal history of other diseases of the respiratory system: Secondary | ICD-10-CM | POA: Diagnosis not present

## 2018-01-31 DIAGNOSIS — M25561 Pain in right knee: Secondary | ICD-10-CM

## 2018-01-31 DIAGNOSIS — Z8639 Personal history of other endocrine, nutritional and metabolic disease: Secondary | ICD-10-CM

## 2018-01-31 DIAGNOSIS — F32A Depression, unspecified: Secondary | ICD-10-CM

## 2018-01-31 DIAGNOSIS — G8929 Other chronic pain: Secondary | ICD-10-CM | POA: Diagnosis not present

## 2018-01-31 DIAGNOSIS — M2550 Pain in unspecified joint: Secondary | ICD-10-CM

## 2018-01-31 DIAGNOSIS — R768 Other specified abnormal immunological findings in serum: Secondary | ICD-10-CM

## 2018-01-31 DIAGNOSIS — I7300 Raynaud's syndrome without gangrene: Secondary | ICD-10-CM | POA: Diagnosis not present

## 2018-01-31 DIAGNOSIS — F419 Anxiety disorder, unspecified: Secondary | ICD-10-CM

## 2018-01-31 DIAGNOSIS — Z8719 Personal history of other diseases of the digestive system: Secondary | ICD-10-CM

## 2018-01-31 MED ORDER — LIDOCAINE HCL (PF) 1 % IJ SOLN
1.5000 mL | INTRAMUSCULAR | Status: AC | PRN
  Administered 2018-01-31: 1.5 mL

## 2018-01-31 MED ORDER — TRIAMCINOLONE ACETONIDE 40 MG/ML IJ SUSP
40.0000 mg | INTRAMUSCULAR | Status: AC | PRN
  Administered 2018-01-31: 40 mg via INTRA_ARTICULAR

## 2018-01-31 NOTE — Patient Instructions (Signed)

## 2018-02-06 DIAGNOSIS — J3081 Allergic rhinitis due to animal (cat) (dog) hair and dander: Secondary | ICD-10-CM | POA: Diagnosis not present

## 2018-02-06 DIAGNOSIS — J3089 Other allergic rhinitis: Secondary | ICD-10-CM | POA: Diagnosis not present

## 2018-02-06 DIAGNOSIS — J301 Allergic rhinitis due to pollen: Secondary | ICD-10-CM | POA: Diagnosis not present

## 2018-02-06 LAB — URINALYSIS, ROUTINE W REFLEX MICROSCOPIC
BILIRUBIN URINE: NEGATIVE
Glucose, UA: NEGATIVE
Hgb urine dipstick: NEGATIVE
KETONES UR: NEGATIVE
Leukocytes, UA: NEGATIVE
NITRITE: NEGATIVE
PH: 8 (ref 5.0–8.0)
Protein, ur: NEGATIVE
Specific Gravity, Urine: 1.016 (ref 1.001–1.03)

## 2018-02-06 LAB — RFX DRVVT 1:1 MIX

## 2018-02-06 LAB — LUPUS ANTICOAGULANT EVAL W/ REFLEX
DRVVT SCREEN: 46 s — AB (ref <=45)
PTT LA SCREEN: 35 s (ref <=40)

## 2018-02-06 LAB — CRYOGLOBULIN: CRYOGLOBULIN, QUALITATIVE ANALYSIS: NOT DETECTED

## 2018-02-06 LAB — C3 AND C4
C3 COMPLEMENT: 137 mg/dL (ref 83–193)
C4 Complement: 31 mg/dL (ref 15–57)

## 2018-02-06 LAB — BETA-2 GLYCOPROTEIN ANTIBODIES
Beta-2 Glyco 1 IgM: <9 SMU (ref <=20)
Beta-2 Glyco I IgG: <9 SGU (ref <=20)

## 2018-02-06 LAB — ANGIOTENSIN CONVERTING ENZYME: Angiotensin-Converting Enzyme: 47 U/L (ref 9–67)

## 2018-02-06 LAB — HLA-B27 ANTIGEN: HLA-B27 ANTIGEN: NEGATIVE

## 2018-02-06 LAB — CYCLIC CITRUL PEPTIDE ANTIBODY, IGG

## 2018-02-06 LAB — RHEUMATOID FACTOR: Rhuematoid fact SerPl-aCnc: <14 IU/mL (ref <14)

## 2018-02-06 LAB — URIC ACID: Uric Acid, Serum: 4.3 mg/dL (ref 2.5–7.0)

## 2018-02-06 LAB — PAN-ANCA: ANCA SCREEN: NEGATIVE

## 2018-02-06 LAB — CARDIOLIPIN ANTIBODIES, IGG, IGM, IGA
Anticardiolipin IgA: <11 [APL'U]
Anticardiolipin IgG: <14 [GPL'U]

## 2018-02-06 LAB — RFLX DRVVT CONFRIM: dRVVT Confirm: POSITIVE — AB

## 2018-02-06 LAB — VITAMIN D 25 HYDROXY (VIT D DEFICIENCY, FRACTURES): VIT D 25 HYDROXY: 63 ng/mL (ref 30–100)

## 2018-02-06 NOTE — Progress Notes (Signed)
Will discuss at fu visit

## 2018-02-11 DIAGNOSIS — N871 Moderate cervical dysplasia: Secondary | ICD-10-CM | POA: Diagnosis not present

## 2018-02-11 DIAGNOSIS — N870 Mild cervical dysplasia: Secondary | ICD-10-CM | POA: Diagnosis not present

## 2018-02-11 DIAGNOSIS — N879 Dysplasia of cervix uteri, unspecified: Secondary | ICD-10-CM | POA: Diagnosis not present

## 2018-02-13 DIAGNOSIS — J3089 Other allergic rhinitis: Secondary | ICD-10-CM | POA: Diagnosis not present

## 2018-02-13 DIAGNOSIS — J301 Allergic rhinitis due to pollen: Secondary | ICD-10-CM | POA: Diagnosis not present

## 2018-02-13 DIAGNOSIS — J3081 Allergic rhinitis due to animal (cat) (dog) hair and dander: Secondary | ICD-10-CM | POA: Diagnosis not present

## 2018-02-20 DIAGNOSIS — J301 Allergic rhinitis due to pollen: Secondary | ICD-10-CM | POA: Diagnosis not present

## 2018-02-20 DIAGNOSIS — J3089 Other allergic rhinitis: Secondary | ICD-10-CM | POA: Diagnosis not present

## 2018-02-20 DIAGNOSIS — J3081 Allergic rhinitis due to animal (cat) (dog) hair and dander: Secondary | ICD-10-CM | POA: Diagnosis not present

## 2018-02-25 ENCOUNTER — Ambulatory Visit: Payer: Self-pay | Admitting: Rheumatology

## 2018-02-27 DIAGNOSIS — J3089 Other allergic rhinitis: Secondary | ICD-10-CM | POA: Diagnosis not present

## 2018-02-27 DIAGNOSIS — J3081 Allergic rhinitis due to animal (cat) (dog) hair and dander: Secondary | ICD-10-CM | POA: Diagnosis not present

## 2018-02-27 DIAGNOSIS — J301 Allergic rhinitis due to pollen: Secondary | ICD-10-CM | POA: Diagnosis not present

## 2018-03-05 DIAGNOSIS — R69 Illness, unspecified: Secondary | ICD-10-CM | POA: Diagnosis not present

## 2018-03-06 DIAGNOSIS — J301 Allergic rhinitis due to pollen: Secondary | ICD-10-CM | POA: Diagnosis not present

## 2018-03-06 DIAGNOSIS — J3089 Other allergic rhinitis: Secondary | ICD-10-CM | POA: Diagnosis not present

## 2018-03-06 DIAGNOSIS — J3081 Allergic rhinitis due to animal (cat) (dog) hair and dander: Secondary | ICD-10-CM | POA: Diagnosis not present

## 2018-03-12 ENCOUNTER — Ambulatory Visit: Payer: Self-pay | Admitting: Rheumatology

## 2018-03-12 NOTE — Progress Notes (Signed)
Office Visit Note  Patient: Misty [name]             Date of Birth: [birth year]           MRN: [id number]             PCP: Colon Branch, MD Referring: Colon Branch, MD Visit Date: 03/15/2018 Occupation: _0 @    Subjective:  Knee pain.   History of Present Illness: Misty [name] is a 52 y.o. female with history of polyarthralgia, Raynauds and osteoarthritis.  She states her generalized joint pain has resolved.  Renal symptoms have been better with the weather change.  She continues to have some discomfort in her knee joints.  She had some relief from cortisone injection but she still has some chronic discomfort in her knee which interferes with her daily activities.  Activities of Daily Living:  Patient reports morning stiffness for 5 minute.   Patient Denies nocturnal pain.  Difficulty dressing/grooming: Denies Difficulty climbing stairs: Reports Difficulty getting out of chair: Denies Difficulty using hands for taps, buttons, cutlery, and/or writing: Denies   Review of Systems  Constitutional: Positive for weight loss. Negative for fatigue, night sweats and weight gain.       Intentional  HENT: Negative for mouth sores, trouble swallowing, trouble swallowing, mouth dryness and nose dryness.   Eyes: Negative for pain, redness, visual disturbance and dryness.  Respiratory: Negative for cough, shortness of breath and difficulty breathing.   Cardiovascular: Negative for chest pain, palpitations, hypertension, irregular heartbeat and swelling in legs/feet.  Gastrointestinal: Negative for blood in stool, constipation and diarrhea.  Endocrine: Negative for increased urination.  Genitourinary: Negative for vaginal dryness.  Musculoskeletal: Positive for arthralgias, joint pain and morning stiffness. Negative for joint swelling, myalgias, muscle weakness, muscle tenderness and myalgias.  Skin: Positive for rash. Negative for color change, hair loss, skin tightness, ulcers and  sensitivity to sunlight.       Eczema  Allergic/Immunologic: Negative for susceptible to infections.  Neurological: Negative for dizziness, memory loss, night sweats and weakness.  Hematological: Negative for swollen glands.  Psychiatric/Behavioral: Negative for sleep disturbance. The patient is not nervous/anxious.     PMFS History:  Patient Active Problem List   Diagnosis Date Noted  . PCP NOTES >>>>>>>>>>>>>>>>>>>>>>>>>>>>>>>> 02/24/2016  . Annual physical exam 04/12/2015  . FH: CAD (coronary artery disease) 08/25/2014  . Esophageal ulcer 08/03/2014  . *Depression 05/12/2009  . ECZEMA 05/12/2009  . OSTEOARTHRITIS 05/12/2009  . *Hyperlipidemia 03/26/2008  . 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 LACTOSE INTOLERANCE 03/24/2008  . Asthma, f/u by her allergist  03/24/2008  . 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 Acne    from IUD/hormonal replacement  . Allergy    seasonal  . Anxiety   . Anxiety and depression   . Asthma   . Depression   . Esophageal ulcer    by EGD 08-03-2014  . GERD (gastroesophageal reflux disease)   . IUD (intrauterine device) in place   . Lactose intolerance   . Nonspecific abnormal results of thyroid function study   . Osteoarthritis    Dr.Collins   . Other and unspecified hyperlipidemia   . Thyroid disease     Family History  Problem Relation Age of Onset  . COPD Mother        smoker  . Heart disease Mother        stent @ 47; ablation for tachycardia  . Heart attack  Father 42  . Lung disease Father        mesothelioma  . Hypertension Father   . Rheum arthritis Sister   . Hyperlipidemia Sister   . Diabetes Maternal Aunt   . Lung cancer Maternal Grandmother   . Stroke Maternal Grandfather   . Breast cancer Paternal Grandmother   . Osteopenia Sister   . Fibromyalgia Sister   . Hyperlipidemia Sister   . Celiac disease Sister   . Heart attack Brother 69  . Eczema Daughter   . Esophageal cancer Neg Hx     . Stomach cancer Neg Hx   . Colon cancer Neg Hx   . Rectal cancer Neg Hx    Past Surgical History:  Procedure Laterality Date  . BILATERAL KNEE ARTHROSCOPY     Left: 11/10/2008, Right: 04/15/2009. Dr.Andy Collins  . G 1 P 1    . GANGLION CYST EXCISION     Right Wrist   . LEEP  03/15/2018  . SPIROMETRY  01/2016  . TONSILLECTOMY AND ADENOIDECTOMY     Social History   Social History Narrative   Household-- pt, boyfriend  and  Child 2003   divorced     Objective: Vital Signs: BP 133/84 (BP Location: Left Arm, Patient Position: Sitting, Cuff Size: Normal)   Pulse 87   Resp 14   Ht _0  (1.676 m)   Wt 198 lb (89.8 kg)   BMI 31.96 kg/m    Physical Exam  Constitutional: She is oriented to person, place, and time. She appears well-developed and well-nourished.  HENT:  Head: Normocephalic and atraumatic.  Eyes: Conjunctivae and EOM are normal.  Neck: Normal range of motion.  Cardiovascular: Normal rate, regular rhythm, normal heart sounds and intact distal pulses.  Pulmonary/Chest: Effort normal and breath sounds normal.  Abdominal: Soft. Bowel sounds are normal.  Lymphadenopathy:    She has no cervical adenopathy.  Neurological: She is alert and oriented to person, place, and time.  Skin: Skin is warm and dry. Capillary refill takes less than 2 seconds.  Psychiatric: She has a normal mood and affect. Her behavior is normal.  Nursing note and vitals reviewed.    Musculoskeletal Exam: C-spine thoracic lumbar spine good range of motion.  Shoulder joints elbow joints wrist joint MCPs PIPs DIPs were in good range of motion with no synovitis.  Hip joints knee joints ankles MTPs PIPs in good range of motion with no synovitis.  She had crepitus and discomfort range of motion of bilateral knee joints.  CDAI Exam: No CDAI exam completed.    Investigation: No additional findings. January 31, 2018 UA negative, Lupus anticoagulant negative, beta-2 negative, anticardiolipin  negative, C3-C4 normal,ANCA negative, RF negative, anti-CCP negative, cryoglobulins negative, is negative, HLA-B27 negative, uric acid 4.3, vitamin D 63  Imaging: No results found.  Speciality Comments: No specialty comments available.    Procedures:  No procedures performed Allergies: Lactose intolerance (gi); Latex; and Penicillins   Assessment / Plan:     Visit Diagnoses: Polyarthralgia - ANA 1: 160 homogeneous, all autoimmune work-up negative.  Her arthralgias have improved over time.  The labs were discussed at length.  Raynaud's disease without gangrene-renal symptoms have improved with the weather change.  Primary osteoarthritis of both knees - Right moderate to severe, left moderate, bilateral moderate chondromalacia patella.  Right knee injected with cortisone last visit.  She had good response to cortisone injection.  Although she does have chronic discomfort in her knee joints.  Different treatment options  were discussed.  She is trying weight loss diet and exercise.  Need for exercise and dieting was emphasized.  I will also apply for Visco supplement injections.  Other fatigue-improved  Anxiety and depression-improved.  Medical problems are listed as follows:  Dyslipidemia  Hashimoto's thyroiditis  History of rosacea  History of eczema  History of asthma  History of gastroesophageal reflux (GERD)  History of esophageal ulcer    Orders: No orders of the defined types were placed in this encounter.  No orders of the defined types were placed in this encounter.   Face-to-face time spent with patient was 20 minutes.  Greater than 50% of time was spent in counseling and coordination of care.  Follow-Up Instructions: Return in about 1 year (around 03/16/2019) for Osteoarthritis.   Bo Merino, MD  Note - This record has been created using Editor, commissioning.  Chart creation errors have been sought, but may not always  have been located. Such creation  errors do not reflect on  the standard of medical care.

## 2018-03-13 DIAGNOSIS — J301 Allergic rhinitis due to pollen: Secondary | ICD-10-CM | POA: Diagnosis not present

## 2018-03-13 DIAGNOSIS — J3081 Allergic rhinitis due to animal (cat) (dog) hair and dander: Secondary | ICD-10-CM | POA: Diagnosis not present

## 2018-03-13 DIAGNOSIS — J3089 Other allergic rhinitis: Secondary | ICD-10-CM | POA: Diagnosis not present

## 2018-03-15 ENCOUNTER — Encounter: Payer: Self-pay | Admitting: Rheumatology

## 2018-03-15 ENCOUNTER — Ambulatory Visit: Payer: 59 | Admitting: Rheumatology

## 2018-03-15 VITALS — BP 133/84 | HR 87 | Resp 14 | Ht 66.0 in | Wt 198.0 lb

## 2018-03-15 DIAGNOSIS — N871 Moderate cervical dysplasia: Secondary | ICD-10-CM | POA: Diagnosis not present

## 2018-03-15 DIAGNOSIS — R5383 Other fatigue: Secondary | ICD-10-CM

## 2018-03-15 DIAGNOSIS — Z872 Personal history of diseases of the skin and subcutaneous tissue: Secondary | ICD-10-CM | POA: Diagnosis not present

## 2018-03-15 DIAGNOSIS — E063 Autoimmune thyroiditis: Secondary | ICD-10-CM | POA: Diagnosis not present

## 2018-03-15 DIAGNOSIS — F329 Major depressive disorder, single episode, unspecified: Secondary | ICD-10-CM | POA: Diagnosis not present

## 2018-03-15 DIAGNOSIS — M2550 Pain in unspecified joint: Secondary | ICD-10-CM | POA: Diagnosis not present

## 2018-03-15 DIAGNOSIS — Z8709 Personal history of other diseases of the respiratory system: Secondary | ICD-10-CM | POA: Diagnosis not present

## 2018-03-15 DIAGNOSIS — N879 Dysplasia of cervix uteri, unspecified: Secondary | ICD-10-CM | POA: Diagnosis not present

## 2018-03-15 DIAGNOSIS — F419 Anxiety disorder, unspecified: Secondary | ICD-10-CM | POA: Diagnosis not present

## 2018-03-15 DIAGNOSIS — E785 Hyperlipidemia, unspecified: Secondary | ICD-10-CM | POA: Diagnosis not present

## 2018-03-15 DIAGNOSIS — Z8719 Personal history of other diseases of the digestive system: Secondary | ICD-10-CM | POA: Diagnosis not present

## 2018-03-15 DIAGNOSIS — M170 Bilateral primary osteoarthritis of knee: Secondary | ICD-10-CM

## 2018-03-15 DIAGNOSIS — R69 Illness, unspecified: Secondary | ICD-10-CM | POA: Diagnosis not present

## 2018-03-15 DIAGNOSIS — F32A Depression, unspecified: Secondary | ICD-10-CM

## 2018-03-15 DIAGNOSIS — I7300 Raynaud's syndrome without gangrene: Secondary | ICD-10-CM | POA: Diagnosis not present

## 2018-03-15 HISTORY — PX: LEEP: SHX91

## 2018-03-20 DIAGNOSIS — J301 Allergic rhinitis due to pollen: Secondary | ICD-10-CM | POA: Diagnosis not present

## 2018-03-20 DIAGNOSIS — J3081 Allergic rhinitis due to animal (cat) (dog) hair and dander: Secondary | ICD-10-CM | POA: Diagnosis not present

## 2018-03-20 DIAGNOSIS — J3089 Other allergic rhinitis: Secondary | ICD-10-CM | POA: Diagnosis not present

## 2018-03-27 DIAGNOSIS — J3081 Allergic rhinitis due to animal (cat) (dog) hair and dander: Secondary | ICD-10-CM | POA: Diagnosis not present

## 2018-03-27 DIAGNOSIS — J301 Allergic rhinitis due to pollen: Secondary | ICD-10-CM | POA: Diagnosis not present

## 2018-03-27 DIAGNOSIS — J3089 Other allergic rhinitis: Secondary | ICD-10-CM | POA: Diagnosis not present

## 2018-03-28 DIAGNOSIS — R69 Illness, unspecified: Secondary | ICD-10-CM | POA: Diagnosis not present

## 2018-04-03 DIAGNOSIS — J3089 Other allergic rhinitis: Secondary | ICD-10-CM | POA: Diagnosis not present

## 2018-04-03 DIAGNOSIS — J3081 Allergic rhinitis due to animal (cat) (dog) hair and dander: Secondary | ICD-10-CM | POA: Diagnosis not present

## 2018-04-03 DIAGNOSIS — J301 Allergic rhinitis due to pollen: Secondary | ICD-10-CM | POA: Diagnosis not present

## 2018-04-10 DIAGNOSIS — J301 Allergic rhinitis due to pollen: Secondary | ICD-10-CM | POA: Diagnosis not present

## 2018-04-10 DIAGNOSIS — J3081 Allergic rhinitis due to animal (cat) (dog) hair and dander: Secondary | ICD-10-CM | POA: Diagnosis not present

## 2018-04-10 DIAGNOSIS — J3089 Other allergic rhinitis: Secondary | ICD-10-CM | POA: Diagnosis not present

## 2018-04-12 ENCOUNTER — Telehealth (INDEPENDENT_AMBULATORY_CARE_PROVIDER_SITE_OTHER): Payer: Self-pay

## 2018-04-12 NOTE — Telephone Encounter (Signed)
Check with Ivin Booty about this.  I believe this was already applied for if the pharmacist is calling. I don't have patient's VOB.

## 2018-04-12 NOTE — Telephone Encounter (Signed)
Received a phone call from [place], pharmacist with [facility] concerning a PA that is needed for a Euflexxa injection for patient.   I didn't receive anything for this patient.  Cb# is [phone].  Please advise.  Thank you.

## 2018-04-12 NOTE — Telephone Encounter (Signed)
Per office note on 03/15/18  Primary osteoarthritis of both knees - Right moderate to severe, left moderate, bilateral moderate chondromalacia patella.  Right knee injected with cortisone last visit.  She had good response to cortisone injection.  Although she does have chronic discomfort in her knee joints.  Different treatment options were discussed.  She is trying weight loss diet and exercise.  Need for exercise and dieting was emphasized.  I will also apply for Visco supplement injections.

## 2018-04-16 DIAGNOSIS — J301 Allergic rhinitis due to pollen: Secondary | ICD-10-CM | POA: Diagnosis not present

## 2018-04-16 DIAGNOSIS — J4530 Mild persistent asthma, uncomplicated: Secondary | ICD-10-CM | POA: Diagnosis not present

## 2018-04-16 DIAGNOSIS — J3089 Other allergic rhinitis: Secondary | ICD-10-CM | POA: Diagnosis not present

## 2018-04-16 DIAGNOSIS — J3081 Allergic rhinitis due to animal (cat) (dog) hair and dander: Secondary | ICD-10-CM | POA: Diagnosis not present

## 2018-04-22 ENCOUNTER — Other Ambulatory Visit: Payer: Self-pay | Admitting: Internal Medicine

## 2018-04-23 ENCOUNTER — Telehealth (INDEPENDENT_AMBULATORY_CARE_PROVIDER_SITE_OTHER): Payer: Self-pay

## 2018-04-23 NOTE — Telephone Encounter (Signed)
Noted  

## 2018-04-23 NOTE — Telephone Encounter (Signed)
Please apply for visco, bilateral, Dr. Estanislado Pandy. Thank you.

## 2018-04-23 NOTE — Telephone Encounter (Signed)
Submitted application online for Monovisc Injection, bilateral knee. 

## 2018-05-03 ENCOUNTER — Telehealth (INDEPENDENT_AMBULATORY_CARE_PROVIDER_SITE_OTHER): Payer: Self-pay

## 2018-05-03 NOTE — Telephone Encounter (Signed)
Yes please. Thank you

## 2018-05-03 NOTE — Telephone Encounter (Addendum)
Received VOB for patient and it stated that patient no longer has an active medical coverage.   Called and left a VM advising patient to return call concerning insurance information for gel injection.  If patient has no insurance, would you like for me to apply for [employer] Patient Assistance Program?  I will not apply for [employer] until I receive a call back from patient.  Please advise.  Thank you

## 2018-05-07 NOTE — Telephone Encounter (Signed)
Noted. Will try calling patient again on tomorrow.

## 2018-05-07 NOTE — Telephone Encounter (Signed)
Misty [name], please check into this if you have not already.

## 2018-05-08 ENCOUNTER — Telehealth (INDEPENDENT_AMBULATORY_CARE_PROVIDER_SITE_OTHER): Payer: Self-pay

## 2018-05-08 NOTE — Telephone Encounter (Signed)
Misty [name]- Talked with patient and she is aware that she no longer has medical coverage and stated that she will wait until her new coverage starts, which will not be until September of 2019.

## 2018-06-05 ENCOUNTER — Telehealth: Payer: Self-pay | Admitting: Internal Medicine

## 2018-06-05 MED ORDER — ATORVASTATIN CALCIUM 10 MG PO TABS: 10.0000 mg | ORAL_TABLET | Freq: Every day | ORAL | 0 refills | Status: DC

## 2018-06-05 NOTE — Telephone Encounter (Signed)
Copied from [place] [id number]. Topic: Quick Communication - Rx Refill/Question >> Jun 05, 2018  8:51 AM Synthia Innocent wrote: Medication: atorvastatin (LIPITOR) 10 MG tablet   Has the patient contacted their pharmacy? Yes.   (Agent: If no, request that the patient contact the pharmacy for the refill.) (Agent: If yes, when and what did the pharmacy advise?)  Preferred Pharmacy (with phone number or street name): [facility] [address]  Agent: Please be advised that RX refills may take up to 3 business days. We ask that you follow-up with your pharmacy.

## 2018-08-06 ENCOUNTER — Telehealth (INDEPENDENT_AMBULATORY_CARE_PROVIDER_SITE_OTHER): Payer: Self-pay

## 2018-08-06 NOTE — Telephone Encounter (Signed)
Talked with patient and she stated that she will give [place] a call concerning gel injection once she speaks with her insurance concerning her benefits.

## 2018-09-05 ENCOUNTER — Other Ambulatory Visit: Payer: Self-pay | Admitting: Internal Medicine

## 2018-09-11 ENCOUNTER — Telehealth: Payer: Self-pay | Admitting: Rheumatology

## 2018-09-11 NOTE — Telephone Encounter (Signed)
Okay to apply for bilateral knee visco?

## 2018-09-11 NOTE — Telephone Encounter (Signed)
Please apply for Visco for bilateral knees.

## 2018-09-11 NOTE — Telephone Encounter (Signed)
Ok to apply for visco.

## 2018-09-11 NOTE — Telephone Encounter (Signed)
Patient is ready to proceed with Visco injections for bil knees. Patient has a new Dentist now. Info was put into system.

## 2018-09-12 NOTE — Telephone Encounter (Signed)
Noted  

## 2018-09-18 ENCOUNTER — Encounter: Payer: Self-pay | Admitting: Internal Medicine

## 2018-09-18 MED ORDER — ATORVASTATIN CALCIUM 10 MG PO TABS: 10.0000 mg | ORAL_TABLET | Freq: Every day | ORAL | 0 refills | Status: DC

## 2018-09-19 ENCOUNTER — Telehealth (INDEPENDENT_AMBULATORY_CARE_PROVIDER_SITE_OTHER): Payer: Self-pay

## 2018-09-19 NOTE — Telephone Encounter (Signed)
Submitted VOB for Orhtovisc, bilateral knee.

## 2018-10-02 ENCOUNTER — Telehealth (INDEPENDENT_AMBULATORY_CARE_PROVIDER_SITE_OTHER): Payer: Self-pay

## 2018-10-02 NOTE — Telephone Encounter (Signed)
PA required for Orthovisc, bilateral knee. Faxed completed PA form to [place] at [phone].

## 2018-10-03 NOTE — Progress Notes (Signed)
Office Visit Note  Patient: Misty [name]             Date of Birth: October 26, 1966           MRN: [id number]             PCP: Colon Branch, MD Referring: Colon Branch, MD Visit Date: 10/04/2018 Occupation: @GUAROCC @  Subjective:  Pain in both knee joints   History of Present Illness: Misty [name] is a 52 y.o. female with history of osteoarthritis of both knees and Raynaud's.  She is having pain in bilateral knee joints.  She reports that the pain is most severe in the posterior aspect of the left knee joint.  She states that her left knee will buckle on her occasionally.  She denies any recent injuries or falls.  She denies any joint swelling.  She has been taking ibuprofen twice daily and icing bilateral knees at night.  She states that the left knee pain has worsened over the past 1 month after working at [employer].  She denies any catching or locking sensation.  Reapplying for Visco gel injections.  She would like bilateral cortisone injections today.  She states that she continues to have some discomfort in bilateral hands but denies any joint swelling.  She denies any groin or ankle pain.  She denies any recent rashes, sores in her mouth or nose, swollen lymph nodes, hair loss, sicca symptoms, or sun sensitivity.  She continues to have intermittent symptoms in the left hand.  She denies any digital ulcerations.   Activities of Daily Living:  Patient reports morning stiffness for several hours.   Patient Reports nocturnal pain.  Difficulty dressing/grooming: Denies Difficulty climbing stairs: Reports Difficulty getting out of chair: Reports Difficulty using hands for taps, buttons, cutlery, and/or writing: Denies  Review of Systems  Constitutional: Negative for fatigue.  HENT: Negative for mouth sores, trouble swallowing, trouble swallowing, mouth dryness and nose dryness.   Eyes: Negative for pain, redness, itching, visual disturbance and dryness.  Respiratory: Negative  for cough, hemoptysis, shortness of breath, wheezing and difficulty breathing.   Cardiovascular: Negative for chest pain, palpitations, hypertension and swelling in legs/feet.  Gastrointestinal: Negative for abdominal pain, blood in stool, constipation, diarrhea, nausea and vomiting.  Endocrine: Negative for increased urination.  Genitourinary: Negative for painful urination, nocturia and pelvic pain.  Musculoskeletal: Positive for arthralgias, joint pain, joint swelling and morning stiffness. Negative for myalgias, muscle weakness, muscle tenderness and myalgias.  Skin: Negative for color change, pallor, rash, hair loss, nodules/bumps, skin tightness, ulcers and sensitivity to sunlight.  Allergic/Immunologic: Negative for susceptible to infections.  Neurological: Negative for dizziness, light-headedness, numbness, headaches, memory loss and weakness.  Hematological: Negative for bruising/bleeding tendency and swollen glands.  Psychiatric/Behavioral: Negative for depressed mood, confusion and sleep disturbance. The patient is not nervous/anxious.     PMFS History:  Patient Active Problem List   Diagnosis Date Noted  . PCP NOTES >>>>>>>>>>>>>>>>>>>>>>>>>>>>>>>> 02/24/2016  . Annual physical exam 04/12/2015  . FH: CAD (coronary artery disease) 08/25/2014  . Esophageal ulcer 08/03/2014  . *Depression 05/12/2009  . ECZEMA 05/12/2009  . OSTEOARTHRITIS 05/12/2009  . *Hyperlipidemia 03/26/2008  . 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 LACTOSE INTOLERANCE 03/24/2008  . Asthma, f/u by her allergist  03/24/2008  . 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 Acne    from IUD/hormonal replacement  . Allergy    seasonal  . Anxiety   .  Anxiety and depression   . Asthma   . Depression   . Esophageal ulcer    by EGD 08-03-2014  . GERD (gastroesophageal reflux disease)   . IUD (intrauterine device) in place   . Lactose intolerance   . Nonspecific abnormal results of  thyroid function study   . Osteoarthritis    Dr.Collins   . Other and unspecified hyperlipidemia   . Thyroid disease     Family History  Problem Relation Age of Onset  . COPD Mother        smoker  . Heart disease Mother        stent @ 41; ablation for tachycardia  . Heart attack Father 5  . Lung disease Father        mesothelioma  . Hypertension Father   . Rheum arthritis Sister   . Hyperlipidemia Sister   . Diabetes Maternal Aunt   . Lung cancer Maternal Grandmother   . Stroke Maternal Grandfather   . Breast cancer Paternal Grandmother   . Osteopenia Sister   . Fibromyalgia Sister   . Hyperlipidemia Sister   . Celiac disease Sister   . Heart attack Brother 45  . Eczema Daughter   . Esophageal cancer Neg Hx   . Stomach cancer Neg Hx   . Colon cancer Neg Hx   . Rectal cancer Neg Hx    Past Surgical History:  Procedure Laterality Date  . BILATERAL KNEE ARTHROSCOPY     Left: 11/10/2008, Right: 04/15/2009. Dr.Andy Collins  . G 1 P 1    . GANGLION CYST EXCISION     Right Wrist   . LEEP  03/15/2018  . SPIROMETRY  01/2016  . TONSILLECTOMY AND ADENOIDECTOMY     Social History   Social History Narrative   Household-- pt, boyfriend  and  Child 2003   divorced    Objective: Vital Signs: BP 130/86 (BP Location: Left Arm, Patient Position: Sitting, Cuff Size: Normal)   Pulse 81   Resp 13   Ht 5' 5.75" (1.67 m)   Wt 194 lb (88 kg)   BMI 31.55 kg/m    Physical Exam  Constitutional: She is oriented to person, place, and time. She appears well-developed and well-nourished.  HENT:  Head: Normocephalic and atraumatic.  Eyes: Conjunctivae and EOM are normal.  Neck: Normal range of motion.  Cardiovascular: Normal rate, regular rhythm, normal heart sounds and intact distal pulses.  Pulmonary/Chest: Effort normal and breath sounds normal.  Abdominal: Soft. Bowel sounds are normal.  Lymphadenopathy:    She has no cervical adenopathy.  Neurological: She is alert and  oriented to person, place, and time.  Skin: Skin is warm and dry. Capillary refill takes less than 2 seconds.  Psychiatric: She has a normal mood and affect. Her behavior is normal.  Nursing note and vitals reviewed.    Musculoskeletal Exam: C-spine, thoracic spine, lumbar spine good range of motion.  No midline spinal tenderness.  No SI joint tenderness.  Shoulder joints, elbow joints, wrist joints, MCPs and PIPs and DIPs good range of motion no synovitis.  She has complete fist formation bilaterally.  Hip joints, knee joints, ankle joints and MTPs, PIPs, DIPs good range of motion with no synovitis.  No warmth or effusion of bilateral knee joints.  She has tenderness in the posterior aspect of the left knee joint.  Negative McMurray's.  Negative posterior and anterior drawer test. No calf tenderness.  Neurovascularly intact distally. No tenderness of trochanteric bursa bilaterally.  CDAI Exam: CDAI Score: Not documented Patient Global Assessment: Not documented; Provider Global Assessment: Not documented Swollen: Not documented; Tender: Not documented Joint Exam   Not documented   There is currently no information documented on the homunculus. Go to the Rheumatology activity and complete the homunculus joint exam.  Investigation: No additional findings.  Imaging: No results found.  Recent Labs: Lab Results  Component Value Date   WBC 6.8 07/19/2017   HGB 12.1 07/19/2017   PLT 329.0 07/19/2017   NA 139 06/28/2017   K 4.1 06/28/2017   CL 105 06/28/2017   CO2 29 06/28/2017   GLUCOSE 105 (H) 06/28/2017   BUN 11 06/28/2017   CREATININE 0.65 06/28/2017   BILITOT 0.5 06/28/2017   ALKPHOS 64 06/28/2017   AST 9 06/28/2017   ALT 10 06/28/2017   PROT 6.8 06/28/2017   ALBUMIN 4.0 06/28/2017   CALCIUM 8.7 06/28/2017   GFRAA >90 07/30/2014    Speciality Comments: No specialty comments available.  Procedures:  Large Joint Inj: bilateral knee on 10/04/2018 9:33 AM Indications:  pain Details: 27 G 1.5 in needle, medial approach  Arthrogram: No  Medications (Right): 1.5 mL lidocaine 1 %; 40 mg triamcinolone acetonide 40 MG/ML Aspirate (Right): 0 mL Medications (Left): 1.5 mL lidocaine 1 %; 40 mg triamcinolone acetonide 40 MG/ML Aspirate (Left): 0 mL Outcome: tolerated well, no immediate complications Procedure, treatment alternatives, risks and benefits explained, specific risks discussed. Consent was given by the patient. Immediately prior to procedure a time out was called to verify the correct patient, procedure, equipment, support staff and site/side marked as required. Patient was prepped and draped in the usual sterile fashion.     Allergies: Lactose intolerance (gi); Latex; and Penicillins   Assessment / Plan:     Visit Diagnoses: Polyarthralgia - ANA 1: 160 homogeneous, all autoimmune work-up negative.  She has no synovitis on exam.  She has no features of autoimmune disease at this time.   Raynaud's disease without gangrene: She has intermittent symptoms of Raynaud's in the left hand.  No digital ulcerations.  She was encouraged to wear gloves and keep her core body temperature warm.   Primary osteoarthritis of both knees - Right moderate to severe, left moderate, bilateral moderate chondromalacia patella: No warmth or effusion.  Good range of motion.  She is having severe pain in bilateral knee joints worse in the left knee joint.  Her left knee has been buckling at times.  She is been taking ibuprofen for pain relief and using ice at night.  She requested bilateral cortisone injections.  She tolerated procedure well.  Procedure was completed above.  We are applying for Visco gel injections for bilateral knee joints.  Chronic pain of both knees: She has been having worsening pain in bilateral knee joints for the past 1 month.  She reports that the furniture market which she feels exacerbated the left knee pain.  She is having tenderness in the posterior  aspect but has no calf tenderness.  She is neurovascularly intact distally.  She has no mechanical symptoms at this time.  No warmth or effusion noted.  She is good range of motion bilateral knee joints.  She has a history of bilateral meniscal tears and has had arthroscopic surgery of bilateral knee joints in the past.  She has been taking ibuprofen twice daily and using ice at night.  She is responded well to cortisone injections in the past.  She requested bilateral cortisone knee joint injections today.  She tolerated procedure well.  Procedure note is completed above.  Potential side effects were discussed.  She was advised to monitor her blood pressure closely upon the cortisone injection.  We are in the process of applying for Visco gel injections for bilateral knee joints.  Other medical conditions are listed as follows:  Other fatigue  Anxiety and depression  History of esophageal ulcer  History of asthma  History of eczema  History of rosacea  History of gastroesophageal reflux (GERD)  Dyslipidemia  Hashimoto's thyroiditis   Orders: Orders Placed This Encounter  Procedures  . Large Joint Inj   No orders of the defined types were placed in this encounter.   Face-to-face time spent with patient was 30 minutes. Greater than 50% of time was spent in counseling and coordination of care.  Follow-Up Instructions: Return in about 6 months (around 04/04/2019) for Osteoarthritis, Raynaud's syndrome.   Ofilia Neas, PA-C   I examined and evaluated the patient with Hazel Sams PA.  Patient has been experiencing increased pain and discomfort in her bilateral knee joints.  No warmth swelling or effusion was noted.  She has severe osteoarthritis in her knee joints.  We had a discussion regarding total knee replacement as well.  At this point she wants to proceed with Visco supplement injections. Per her request cortisone injection to bilateral knee joints was given.  The plan of care  was discussed as noted above.  Bo Merino, MD  Note - This record has been created using Editor, commissioning.  Chart creation errors have been sought, but may not always  have been located. Such creation errors do not reflect on  the standard of medical care.

## 2018-10-04 ENCOUNTER — Ambulatory Visit: Payer: 59 | Admitting: Rheumatology

## 2018-10-04 ENCOUNTER — Encounter: Payer: Self-pay | Admitting: Rheumatology

## 2018-10-04 VITALS — BP 130/86 | HR 81 | Resp 13 | Ht 65.75 in | Wt 194.0 lb

## 2018-10-04 DIAGNOSIS — R5383 Other fatigue: Secondary | ICD-10-CM

## 2018-10-04 DIAGNOSIS — I7300 Raynaud's syndrome without gangrene: Secondary | ICD-10-CM | POA: Diagnosis not present

## 2018-10-04 DIAGNOSIS — F419 Anxiety disorder, unspecified: Secondary | ICD-10-CM

## 2018-10-04 DIAGNOSIS — M25562 Pain in left knee: Secondary | ICD-10-CM | POA: Diagnosis not present

## 2018-10-04 DIAGNOSIS — F329 Major depressive disorder, single episode, unspecified: Secondary | ICD-10-CM

## 2018-10-04 DIAGNOSIS — M25561 Pain in right knee: Secondary | ICD-10-CM | POA: Diagnosis not present

## 2018-10-04 DIAGNOSIS — G8929 Other chronic pain: Secondary | ICD-10-CM

## 2018-10-04 DIAGNOSIS — F32A Depression, unspecified: Secondary | ICD-10-CM

## 2018-10-04 DIAGNOSIS — M2550 Pain in unspecified joint: Secondary | ICD-10-CM

## 2018-10-04 DIAGNOSIS — E063 Autoimmune thyroiditis: Secondary | ICD-10-CM

## 2018-10-04 DIAGNOSIS — E785 Hyperlipidemia, unspecified: Secondary | ICD-10-CM

## 2018-10-04 DIAGNOSIS — M170 Bilateral primary osteoarthritis of knee: Secondary | ICD-10-CM

## 2018-10-04 DIAGNOSIS — Z8719 Personal history of other diseases of the digestive system: Secondary | ICD-10-CM

## 2018-10-04 DIAGNOSIS — Z8709 Personal history of other diseases of the respiratory system: Secondary | ICD-10-CM

## 2018-10-04 DIAGNOSIS — Z872 Personal history of diseases of the skin and subcutaneous tissue: Secondary | ICD-10-CM

## 2018-10-04 MED ORDER — TRIAMCINOLONE ACETONIDE 40 MG/ML IJ SUSP
40.0000 mg | INTRAMUSCULAR | Status: AC | PRN
  Administered 2018-10-04: 40 mg via INTRA_ARTICULAR

## 2018-10-04 MED ORDER — LIDOCAINE HCL 1 % IJ SOLN
1.5000 mL | INTRAMUSCULAR | Status: AC | PRN
  Administered 2018-10-04: 1.5 mL

## 2018-10-08 ENCOUNTER — Telehealth (INDEPENDENT_AMBULATORY_CARE_PROVIDER_SITE_OTHER): Payer: Self-pay

## 2018-10-08 NOTE — Telephone Encounter (Signed)
Please schedule patient an appointment with Dr. Estanislado Pandy or Lovena Le for gel injection. Thank you  Patient is approved for Orthovisc series, bilateral knee. [place] Covered at 100% through her insurance. $50.00 Co-pay each visit PA required PA Approval# [id number] Valid 10/02/2018- 12/31/2018

## 2018-10-09 NOTE — Telephone Encounter (Signed)
LMOM for patient to call and schedule Orthovisc injections 

## 2018-10-14 NOTE — Telephone Encounter (Signed)
Please schedule appt

## 2019-03-05 ENCOUNTER — Ambulatory Visit: Payer: 59 | Admitting: Rheumatology

## 2019-03-28 ENCOUNTER — Ambulatory Visit: Payer: 59 | Admitting: Rheumatology

## 2019-05-13 ENCOUNTER — Telehealth: Payer: Self-pay | Admitting: Family Medicine

## 2019-05-13 ENCOUNTER — Ambulatory Visit: Payer: Self-pay | Admitting: Internal Medicine

## 2019-05-13 ENCOUNTER — Encounter: Payer: Self-pay | Admitting: Family Medicine

## 2019-05-13 ENCOUNTER — Ambulatory Visit (INDEPENDENT_AMBULATORY_CARE_PROVIDER_SITE_OTHER): Payer: 59 | Admitting: Family Medicine

## 2019-05-13 ENCOUNTER — Telehealth: Payer: Self-pay | Admitting: *Deleted

## 2019-05-13 DIAGNOSIS — Z20828 Contact with and (suspected) exposure to other viral communicable diseases: Secondary | ICD-10-CM | POA: Diagnosis not present

## 2019-05-13 DIAGNOSIS — Z20822 Contact with and (suspected) exposure to covid-19: Secondary | ICD-10-CM

## 2019-05-13 NOTE — Telephone Encounter (Signed)
Scheduled in another encounter.

## 2019-05-13 NOTE — Progress Notes (Signed)
Chief Complaint  Patient presents with  . exposed to Covid    Subjective: Patient is a 53 y.o. female here for exposure to covid. Due to COVID-19 pandemic, we are interacting via web portal for an electronic face-to-face visit. I verified patient's ID using 2 identifiers. Patient agreed to proceed with visit via this method. Patient is at home, I am at office. Patient and I are present for visit.   Pt exposed to someone who tested positive for COVID-19 2 days ago. Pt's known positive test had test 4 d ago, found out results today. They were asymptomatic. Pt is currently asymptomatic. No other recent travel. Exposure took place outside, no masks, were roughly 6 feet apart though, sitting.   ROS: Const: No fevers  Past Medical History:  Diagnosis Date  . Acne    from IUD/hormonal replacement  . Allergy    seasonal  . Anxiety   . Anxiety and depression   . Asthma   . Depression   . Esophageal ulcer    by EGD 08-03-2014  . GERD (gastroesophageal reflux disease)   . IUD (intrauterine device) in place   . Lactose intolerance   . Nonspecific abnormal results of thyroid function study   . Osteoarthritis    Dr.Collins   . Other and unspecified hyperlipidemia   . Thyroid disease     Objective: No conversational dyspnea Age appropriate judgment and insight Nml affect and mood  Assessment and Plan: Exposure to Covid-19 Virus - Plan: Placed in testing queue. Discussed things to look out for.   F/u w me prn.  The patient voiced understanding and agreement to the plan.  [place], DO 05/13/19  4:04 PM

## 2019-05-13 NOTE — Addendum Note (Signed)
Addended by: Carlisle Beers on: 05/13/2019 04:39 PM   Modules accepted: Orders

## 2019-05-13 NOTE — Telephone Encounter (Signed)
Virtual visit w/ Dr. Nani Ravens.

## 2019-05-13 NOTE — Telephone Encounter (Signed)
Pt called back and order placed and appt scheduled for 05/14/19 at [facility]. Pt informed to stay in car and to wear mask to testing site. Pt informed that testing results take 48 to 72 hours.

## 2019-05-13 NOTE — Telephone Encounter (Signed)
Pt. Reports she was in contact with a friend who has tested positive for COVID 19.No symptoms, but would like to talk about testing. Warm transfer to [facility] in the practice.  Answer Assessment - Initial Assessment Questions 1. CLOSE CONTACT: "Who is the person with the confirmed or suspected COVID-19 infection that you were exposed to?"     Friend 2. PLACE of CONTACT: "Where were you when you were exposed to COVID-19?" (e.g., home, school, medical waiting room; which city?)     Home 3. TYPE of CONTACT: "How much contact was there?" (e.g., sitting next to, live in same house, work in same office, same building)     Visit 4. DURATION of CONTACT: "How long were you in contact with the COVID-19 patient?" (e.g., a few seconds, passed by person, a few minutes, live with the patient)     Several hours 5. DATE of CONTACT: "When did you have contact with a COVID-19 patient?" (e.g., how many days ago)     Sunday 6. TRAVEL: "Have you traveled out of the country recently?" If so, "When and where?"     * Also ask about out-of-state travel, since the CDC has identified some high-risk cities for community spread in the [place].     * Note: Travel becomes less relevant if there is widespread community transmission where the patient lives.     No 7. COMMUNITY SPREAD: "Are there lots of cases of COVID-19 (community spread) where you live?" (See public health department website, if unsure)       Yes 8. SYMPTOMS: "Do you have any symptoms?" (e.g., fever, cough, breathing difficulty)     No 9. PREGNANCY OR POSTPARTUM: "Is there any chance you are pregnant?" "When was your last menstrual period?" "Did you deliver in the last 2 weeks?"     No 10. HIGH RISK: "Do you have any heart or lung problems? Do you have a weak immune system?" (e.g., CHF, COPD, asthma, HIV positive, chemotherapy, renal failure, diabetes mellitus, sickle cell anemia)       Asthma  Protocols used: CORONAVIRUS (COVID-19) EXPOSURE-A-AH

## 2019-05-13 NOTE — Telephone Encounter (Signed)
Left message on voicemail for the pt to return call to schedule COVID-19 testing.

## 2019-05-13 NOTE — Telephone Encounter (Signed)
Pt referred for COVID-19 testing by Dr. Rudi Coco due to exposure to COVID-19.  Pt called and left message  to return call to be scheduled for testing.

## 2019-05-14 ENCOUNTER — Other Ambulatory Visit: Payer: 59

## 2019-05-14 DIAGNOSIS — Z20822 Contact with and (suspected) exposure to covid-19: Secondary | ICD-10-CM

## 2019-05-18 LAB — NOVEL CORONAVIRUS, NAA: SARS-CoV-2, NAA: NOT DETECTED

## 2019-06-23 ENCOUNTER — Encounter: Payer: Self-pay | Admitting: Internal Medicine

## 2019-06-26 ENCOUNTER — Telehealth: Payer: Self-pay | Admitting: Internal Medicine

## 2019-06-26 NOTE — Telephone Encounter (Signed)
Called Pt stated she already went to urgent care

## 2019-07-30 ENCOUNTER — Telehealth: Payer: Self-pay

## 2019-07-30 NOTE — Telephone Encounter (Signed)
She qualify for Shingrix, okay to receive at her convenience She already had a pneumonia shot, no booster needed at this time. I do recommend a Tdap and flu shot this season

## 2019-07-30 NOTE — Telephone Encounter (Signed)
Spoke w/ Pt- informed of recommendations. Pt scheduled for flu shot on 08/07/2019 and would like Shingrix, TDAP at same time.

## 2019-07-30 NOTE — Telephone Encounter (Signed)
Please advise 

## 2019-07-30 NOTE — Telephone Encounter (Signed)
Copied from [facility] [place] [phone]. Topic: General - Other >> Jul 29, 2019  5:08 PM Pauline Good wrote: Reason for CRM: pt want to know if she can get the Shingles and Pneumonia vaccine. Please advise

## 2019-08-07 ENCOUNTER — Ambulatory Visit (INDEPENDENT_AMBULATORY_CARE_PROVIDER_SITE_OTHER): Payer: 59 | Admitting: *Deleted

## 2019-08-07 ENCOUNTER — Telehealth: Payer: Self-pay | Admitting: *Deleted

## 2019-08-07 ENCOUNTER — Other Ambulatory Visit: Payer: Self-pay

## 2019-08-07 DIAGNOSIS — Z23 Encounter for immunization: Secondary | ICD-10-CM | POA: Diagnosis not present

## 2019-08-07 NOTE — Progress Notes (Signed)
Patient here for flu, first shingles, and tdap vaccine.    Vaccines given and patient tolerated well.

## 2019-08-07 NOTE — Telephone Encounter (Signed)
Error

## 2019-10-08 ENCOUNTER — Other Ambulatory Visit: Payer: Self-pay

## 2019-10-08 ENCOUNTER — Ambulatory Visit (INDEPENDENT_AMBULATORY_CARE_PROVIDER_SITE_OTHER): Payer: 59 | Admitting: *Deleted

## 2019-10-08 DIAGNOSIS — Z23 Encounter for immunization: Secondary | ICD-10-CM | POA: Diagnosis not present

## 2019-10-08 NOTE — Progress Notes (Signed)
Patient here for second shingles vaccine.  Vaccine given in right deltoid and patient tolerated well. 

## 2019-12-03 DIAGNOSIS — J3081 Allergic rhinitis due to animal (cat) (dog) hair and dander: Secondary | ICD-10-CM | POA: Diagnosis not present

## 2019-12-03 DIAGNOSIS — J3089 Other allergic rhinitis: Secondary | ICD-10-CM | POA: Diagnosis not present

## 2019-12-03 DIAGNOSIS — J301 Allergic rhinitis due to pollen: Secondary | ICD-10-CM | POA: Diagnosis not present

## 2019-12-05 DIAGNOSIS — L814 Other melanin hyperpigmentation: Secondary | ICD-10-CM | POA: Diagnosis not present

## 2019-12-05 DIAGNOSIS — L309 Dermatitis, unspecified: Secondary | ICD-10-CM | POA: Diagnosis not present

## 2019-12-05 DIAGNOSIS — H0265 Xanthelasma of left lower eyelid: Secondary | ICD-10-CM | POA: Diagnosis not present

## 2019-12-05 DIAGNOSIS — L918 Other hypertrophic disorders of the skin: Secondary | ICD-10-CM | POA: Diagnosis not present

## 2019-12-05 DIAGNOSIS — L738 Other specified follicular disorders: Secondary | ICD-10-CM | POA: Diagnosis not present

## 2019-12-05 DIAGNOSIS — L819 Disorder of pigmentation, unspecified: Secondary | ICD-10-CM | POA: Diagnosis not present

## 2019-12-05 DIAGNOSIS — L718 Other rosacea: Secondary | ICD-10-CM | POA: Diagnosis not present

## 2019-12-10 DIAGNOSIS — J3081 Allergic rhinitis due to animal (cat) (dog) hair and dander: Secondary | ICD-10-CM | POA: Diagnosis not present

## 2019-12-10 DIAGNOSIS — J3089 Other allergic rhinitis: Secondary | ICD-10-CM | POA: Diagnosis not present

## 2019-12-10 DIAGNOSIS — J301 Allergic rhinitis due to pollen: Secondary | ICD-10-CM | POA: Diagnosis not present

## 2019-12-18 DIAGNOSIS — Z1231 Encounter for screening mammogram for malignant neoplasm of breast: Secondary | ICD-10-CM | POA: Diagnosis not present

## 2019-12-18 DIAGNOSIS — Z01419 Encounter for gynecological examination (general) (routine) without abnormal findings: Secondary | ICD-10-CM | POA: Diagnosis not present

## 2019-12-18 DIAGNOSIS — Z87410 Personal history of cervical dysplasia: Secondary | ICD-10-CM | POA: Diagnosis not present

## 2019-12-18 DIAGNOSIS — Z6836 Body mass index (BMI) 36.0-36.9, adult: Secondary | ICD-10-CM | POA: Diagnosis not present

## 2019-12-24 DIAGNOSIS — J3081 Allergic rhinitis due to animal (cat) (dog) hair and dander: Secondary | ICD-10-CM | POA: Diagnosis not present

## 2019-12-24 DIAGNOSIS — J301 Allergic rhinitis due to pollen: Secondary | ICD-10-CM | POA: Diagnosis not present

## 2019-12-24 DIAGNOSIS — J3089 Other allergic rhinitis: Secondary | ICD-10-CM | POA: Diagnosis not present

## 2019-12-28 NOTE — Progress Notes (Addendum)
[place] [facility] [address] [place], [place] [address] [phone]  Date:  12/31/2019   Name:  Misty [name]   DOB:  June 12, 1966   MRN:  [id number]  PCP:  Darreld Mclean, MD    Chief Complaint: Annual Exam   History of Present Illness:  Misty [name] is a 54 y.o. very pleasant female patient who presents with the following:  Here today for routine physical-I have not seen this patient in the past, although she has been seen by my partner Dr. Nani Ravens over the summer I take care of her daugher Misty [name]   History of hyperlipidemia, esophageal ulcer, asthma, OA/ polyarthralgia treated by rheumatology/ Dr Theda Sers at [facility] She has a meniscal tear in both knees.  They are thinking of repairing this eventually vs joint replacement She did do Synvisc already   She had put on some weight and is trying to lose this   Wt Readings from Last 3 Encounters:  12/31/19 213 lb (96.6 kg)  10/04/18 194 lb (88 kg)  03/15/18 198 lb (89.8 kg)   Pt of Dr Gaetano Net for GYN care  Pap UTD Mammogram UTD Colon cancer screening up-to-date Immunizations complete, including Shingrix.  One covid shot so far  Lab work appears to be due- she is fasting today  She relates that she was told she had "borderline Hashimoto's" thyroiditis in the past    BP Readings from Last 3 Encounters:  12/31/19 136/84  10/04/18 130/86  03/15/18 133/84   effexor singulair Asa 81 lipitor She would like to check her thyroid levels today  She is in management with [facility] [facility]   She notes occasional momentary palpitations- thought to be PVC, no CP or SOB Has not happened recently   Patient Active Problem List   Diagnosis Date Noted  . FH: CAD (coronary artery disease) 08/25/2014  . Esophageal ulcer 08/03/2014  . *Depression 05/12/2009  . ECZEMA 05/12/2009  . OSTEOARTHRITIS 05/12/2009  . *Hyperlipidemia 03/26/2008  . PREMENSTRUAL  TENSION SYNDROMES 03/26/2008  . LACTOSE INTOLERANCE 03/24/2008  . Asthma, f/u by her allergist  03/24/2008  . 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 Acne    from IUD/hormonal replacement  . Allergy    seasonal  . Anxiety   . Anxiety and depression   . Asthma   . Depression   . Esophageal ulcer    by EGD 08-03-2014  . GERD (gastroesophageal reflux disease)   . IUD (intrauterine device) in place   . Lactose intolerance   . Nonspecific abnormal results of thyroid function study   . Osteoarthritis    Dr.Collins   . Other and unspecified hyperlipidemia   . Thyroid disease     Past Surgical History:  Procedure Laterality Date  . BILATERAL KNEE ARTHROSCOPY     Left: 11/10/2008, Right: 04/15/2009. Dr.Andy Collins  . G 1 P 1    . GANGLION CYST EXCISION     Right Wrist   . LEEP  03/15/2018  . SPIROMETRY  01/2016  . TONSILLECTOMY AND ADENOIDECTOMY      Social History   Tobacco Use  . Smoking status: Never Smoker  . Smokeless tobacco: Never Used  Substance Use Topics  . Alcohol use: Yes    Alcohol/week: 2.0 standard drinks    Types: 2 Glasses of wine per week    Comment: Socially  . Drug use: No  Family History  Problem Relation Age of Onset  . COPD Mother        smoker  . Heart disease Mother        stent @ 43; ablation for tachycardia  . Heart attack Father 15  . Lung disease Father        mesothelioma  . Hypertension Father   . Rheum arthritis Sister   . Hyperlipidemia Sister   . Diabetes Maternal Aunt   . Lung cancer Maternal Grandmother   . Stroke Maternal Grandfather   . Breast cancer Paternal Grandmother   . Osteopenia Sister   . Fibromyalgia Sister   . Hyperlipidemia Sister   . Celiac disease Sister   . Heart attack Brother 22  . Eczema Daughter   . Esophageal cancer Neg Hx   . Stomach cancer Neg Hx   . Colon cancer Neg Hx   . Rectal cancer Neg Hx     Allergies  Allergen Reactions  . Lactose Intolerance (Gi) Other  (See Comments)    GI side effects  . Latex     Makes face itchy  . Penicillins     Unspecified reaction as an infant    Medication list has been reviewed and updated.  Current Outpatient Medications on File Prior to Visit  Medication Sig Dispense Refill  . albuterol (PROAIR HFA) 108 (90 BASE) MCG/ACT inhaler Inhale into the lungs every 6 (six) hours as needed for wheezing or shortness of breath.    Marland Kitchen aspirin 81 MG tablet Take 81 mg by mouth daily. Reported on 02/23/2016    . BIOTIN PO Take 1 tablet by mouth daily.     . budesonide-formoterol (SYMBICORT) 80-4.5 MCG/ACT inhaler Inhale 2 puffs into the lungs 2 (two) times daily.    . Calcium-Magnesium-Vitamin D (CALCIUM MAGNESIUM PO) Take 1 tablet by mouth daily.     . Cholecalciferol (VITAMIN D3) 2000 units capsule Take 2,000 Units by mouth daily.    . Cyanocobalamin (B-12 PO) Take 1 tablet by mouth daily.     Marland Kitchen EPIPEN 2-PAK 0.3 MG/0.3ML SOAJ injection AS DIRECTED AS NEEDED FOR SYSTEMIC REACTION INJECTION 30 DAYS  1  . Iodine, Kelp, (KELP PO) Take 1 tablet by mouth daily.     Marland Kitchen loratadine (CLARITIN) 10 MG tablet Take 10 mg by mouth as needed.     Marland Kitchen MAGNESIUM PO Take by mouth daily.    . Misc Natural Products (ADRENAL PO) Take 1 tablet by mouth daily.     . Omega-3 Fatty Acids (FISH OIL PO) Take 1 tablet by mouth daily.     Marland Kitchen OVER THE COUNTER MEDICATION Pro-biotic 1 capsule daily.    . TURMERIC PO Take 1 tablet by mouth daily.      Current Facility-Administered Medications on File Prior to Visit  Medication Dose Route Frequency Provider Last Rate Last Admin  . 0.9 %  sodium chloride infusion  500 mL Intravenous Continuous Pyrtle, Lajuan Lines, MD        Review of Systems:  .[email or web address]   Physical Examination: Vitals:   12/31/19 1107  BP: 136/84  Pulse: 79  Resp: 16  Temp: (!) 96.7 F (35.9 C)  SpO2: 98%   Vitals:   12/31/19 1107  Weight: 213 lb (96.6 kg)  Height: 5' 5.75" (1.67 m)   Body mass index is 34.64 kg/m. Ideal Body  Weight: Weight in (lb) to have BMI = 25: 153.4  GEN: WDWN, NAD, Non-toxic, A & O x 3, obese, looks well  HEENT: Atraumatic, Normocephalic. Neck supple. No masses, No LAD.  PEERL, TM wnl bilaterally Ears and Nose: No external deformity. CV: RRR, No M/G/R. No JVD. No thrill. No extra heart sounds. PULM: CTA B, no wheezes, crackles, rhonchi. No retractions. No resp. distress. No accessory muscle use. ABD: S, NT, ND, +BS. No rebound. No HSM. EXTR: No c/c/e NEURO Normal gait.  PSYCH: Normally interactive. Conversant. Not depressed or anxious appearing.  Calm demeanor.    Assessment and Plan: Physical exam  Environmental allergies - Plan: montelukast (SINGULAIR) 10 MG tablet  Screening for deficiency anemia - Plan: CBC  Other hyperlipidemia - Plan: atorvastatin (LIPITOR) 10 MG tablet, Lipid panel  Screening for diabetes mellitus - Plan: Comprehensive metabolic panel, Hemoglobin A1c  Screening for thyroid disorder - Plan: TSH  Depression, major, single episode, complete remission (HCC) - Plan: venlafaxine XR (EFFEXOR-XR) 150 MG 24 hr capsule  History of thyroid disorder - Plan: T3, free, Thyroid peroxidase antibody  CPE today  Labs pending as above She has a family history of CAD and hyperlipidemia- I am glad to order an ETT for her if she would like at some point   This visit occurred during the SARS-CoV-2 public health emergency.  Safety protocols were in place, including screening questions prior to the visit, additional usage of staff PPE, and extensive cleaning of exam room while observing appropriate contact time as indicated for disinfecting solutions.    Signed Lamar Blinks, MD  Received her labs as below, message to patient  Results for orders placed or performed in visit on 12/31/19  CBC  Result Value Ref Range   WBC 5.7 4.0 - 10.5 K/uL   RBC 3.93 3.87 - 5.11 Mil/uL   Platelets 238.0 150.0 - 400.0 K/uL   Hemoglobin 12.5 12.0 - 15.0 g/dL   HCT 37.0 36.0 - 46.0 %    MCV 94.1 78.0 - 100.0 fl   MCHC 33.8 30.0 - 36.0 g/dL   RDW 13.4 11.5 - 15.5 %  Comprehensive metabolic panel  Result Value Ref Range   Sodium 138 135 - 145 mEq/L   Potassium 4.2 3.5 - 5.1 mEq/L   Chloride 105 96 - 112 mEq/L   CO2 29 19 - 32 mEq/L   Glucose, Bld 98 70 - 99 mg/dL   BUN 12 6 - 23 mg/dL   Creatinine, Ser 0.65 0.40 - 1.20 mg/dL   Total Bilirubin 0.5 0.2 - 1.2 mg/dL   Alkaline Phosphatase 65 39 - 117 U/L   AST 14 0 - 37 U/L   ALT 14 0 - 35 U/L   Total Protein 6.8 6.0 - 8.3 g/dL   Albumin 4.4 3.5 - 5.2 g/dL   GFR 95.42 >60.00 mL/min   Calcium 9.3 8.4 - 10.5 mg/dL  Hemoglobin A1c  Result Value Ref Range   Hgb A1c MFr Bld 5.4 4.6 - 6.5 %  Lipid panel  Result Value Ref Range   Cholesterol 169 0 - 200 mg/dL   Triglycerides 71.0 0.0 - 149.0 mg/dL   HDL 69.00 >39.00 mg/dL   VLDL 14.2 0.0 - 40.0 mg/dL   LDL Cholesterol 86 0 - 99 mg/dL   Total CHOL/HDL Ratio 2    NonHDL 100.33   TSH  Result Value Ref Range   TSH 0.72 0.35 - 4.50 uIU/mL  T3, free  Result Value Ref Range   T3, Free 3.3 2.3 - 4.2 pg/mL  Thyroid peroxidase antibody  Result Value Ref Range   Thyroperoxidase Ab SerPl-aCnc 687 (H) <9  IU/mL    The 10-year ASCVD risk score Mikey Bussing DC Brooke Bonito., et al., 2013) is: 1.1%   Values used to calculate the score:     Age: 38 years     Sex: Female     Is Non-Hispanic African American: No     Diabetic: No     Tobacco smoker: No     Systolic Blood Pressure: [id number] mmHg     Is BP treated: No     HDL Cholesterol: 69 mg/dL     Total Cholesterol: 169 mg/dL   Addendum 2/11, received her thyroid peroxidase antibody which is elevated

## 2019-12-28 NOTE — Patient Instructions (Addendum)
It was very nice to see you today- I will be in touch with your labs asap  If you like, I am glad to order a treadmill test for you at some point in the future  BP goal 115- 140/70- 90. Please let me know if you are running outside of these para maters on a regular basis   Health Maintenance, Female Adopting a healthy lifestyle and getting preventive care are important in promoting health and wellness. Ask your health care provider about:  The right schedule for you to have regular tests and exams.  Things you can do on your own to prevent diseases and keep yourself healthy. What should I know about diet, weight, and exercise? Eat a healthy diet   Eat a diet that includes plenty of vegetables, fruits, low-fat dairy products, and lean protein.  Do not eat a lot of foods that are high in solid fats, added sugars, or sodium. Maintain a healthy weight Body mass index (BMI) is used to identify weight problems. It estimates body fat based on height and weight. Your health care provider can help determine your BMI and help you achieve or maintain a healthy weight. Get regular exercise Get regular exercise. This is one of the most important things you can do for your health. Most adults should:  Exercise for at least 150 minutes each week. The exercise should increase your heart rate and make you sweat (moderate-intensity exercise).  Do strengthening exercises at least twice a week. This is in addition to the moderate-intensity exercise.  Spend less time sitting. Even light physical activity can be beneficial. Watch cholesterol and blood lipids Have your blood tested for lipids and cholesterol at 53 years of age, then have this test every 5 years. Have your cholesterol levels checked more often if:  Your lipid or cholesterol levels are high.  You are older than 53 years of age.  You are at high risk for heart disease. What should I know about cancer screening? Depending on your health  history and family history, you may need to have cancer screening at various ages. This may include screening for:  Breast cancer.  Cervical cancer.  Colorectal cancer.  Skin cancer.  Lung cancer. What should I know about heart disease, diabetes, and high blood pressure? Blood pressure and heart disease  High blood pressure causes heart disease and increases the risk of stroke. This is more likely to develop in people who have high blood pressure readings, are of African descent, or are overweight.  Have your blood pressure checked: ? Every 3-5 years if you are 68-31 years of age. ? Every year if you are 29 years old or older. Diabetes Have regular diabetes screenings. This checks your fasting blood sugar level. Have the screening done:  Once every three years after age 69 if you are at a normal weight and have a low risk for diabetes.  More often and at a younger age if you are overweight or have a high risk for diabetes. What should I know about preventing infection? Hepatitis B If you have a higher risk for hepatitis B, you should be screened for this virus. Talk with your health care provider to find out if you are at risk for hepatitis B infection. Hepatitis C Testing is recommended for:  Everyone born from [birth year] through 1964.  Anyone with known risk factors for hepatitis C. Sexually transmitted infections (STIs)  Get screened for STIs, including gonorrhea and chlamydia, if: ? You are sexually  active and are younger than 53 years of age. ? You are older than 53 years of age and your health care provider tells you that you are at risk for this type of infection. ? Your sexual activity has changed since you were last screened, and you are at increased risk for chlamydia or gonorrhea. Ask your health care provider if you are at risk.  Ask your health care provider about whether you are at high risk for HIV. Your health care provider may recommend a prescription medicine to  help prevent HIV infection. If you choose to take medicine to prevent HIV, you should first get tested for HIV. You should then be tested every 3 months for as long as you are taking the medicine. Pregnancy  If you are about to stop having your period (premenopausal) and you may become pregnant, seek counseling before you get pregnant.  Take 400 to 800 micrograms (mcg) of folic acid every day if you become pregnant.  Ask for birth control (contraception) if you want to prevent pregnancy. Osteoporosis and menopause Osteoporosis is a disease in which the bones lose minerals and strength with aging. This can result in bone fractures. If you are 84 years old or older, or if you are at risk for osteoporosis and fractures, ask your health care provider if you should:  Be screened for bone loss.  Take a calcium or vitamin D supplement to lower your risk of fractures.  Be given hormone replacement therapy (HRT) to treat symptoms of menopause. Follow these instructions at home: Lifestyle  Do not use any products that contain nicotine or tobacco, such as cigarettes, e-cigarettes, and chewing tobacco. If you need help quitting, ask your health care provider.  Do not use street drugs.  Do not share needles.  Ask your health care provider for help if you need support or information about quitting drugs. Alcohol use  Do not drink alcohol if: ? Your health care provider tells you not to drink. ? You are pregnant, may be pregnant, or are planning to become pregnant.  If you drink alcohol: ? Limit how much you use to 0-1 drink a day. ? Limit intake if you are breastfeeding.  Be aware of how much alcohol is in your drink. In the [place] one drink equals one 12 oz bottle of beer (355 mL), one 5 oz glass of wine (148 mL), or one 1 oz glass of hard liquor (44 mL). General instructions  Schedule regular health, dental, and eye exams.  Stay current with your vaccines.  Tell your health care  provider if: ? You often feel depressed. ? You have ever been abused or do not feel safe at home. Summary  Adopting a healthy lifestyle and getting preventive care are important in promoting health and wellness.  Follow your health care provider's instructions about healthy diet, exercising, and getting tested or screened for diseases.  Follow your health care provider's instructions on monitoring your cholesterol and blood pressure. This information is not intended to replace advice given to you by your health care provider. Make sure you discuss any questions you have with your health care provider. Document Revised: 10/30/2018 Document Reviewed: 10/30/2018 Elsevier Patient Education  2020 [employer].

## 2019-12-30 ENCOUNTER — Other Ambulatory Visit: Payer: Self-pay

## 2019-12-31 ENCOUNTER — Encounter: Payer: Self-pay | Admitting: Family Medicine

## 2019-12-31 ENCOUNTER — Other Ambulatory Visit: Payer: Self-pay

## 2019-12-31 ENCOUNTER — Ambulatory Visit (INDEPENDENT_AMBULATORY_CARE_PROVIDER_SITE_OTHER): Payer: 59 | Admitting: Family Medicine

## 2019-12-31 VITALS — BP 136/84 | HR 79 | Temp 96.7°F | Resp 16 | Ht 65.75 in | Wt 213.0 lb

## 2019-12-31 DIAGNOSIS — Z1329 Encounter for screening for other suspected endocrine disorder: Secondary | ICD-10-CM | POA: Diagnosis not present

## 2019-12-31 DIAGNOSIS — E7849 Other hyperlipidemia: Secondary | ICD-10-CM

## 2019-12-31 DIAGNOSIS — Z9109 Other allergy status, other than to drugs and biological substances: Secondary | ICD-10-CM

## 2019-12-31 DIAGNOSIS — F325 Major depressive disorder, single episode, in full remission: Secondary | ICD-10-CM

## 2019-12-31 DIAGNOSIS — Z8639 Personal history of other endocrine, nutritional and metabolic disease: Secondary | ICD-10-CM | POA: Diagnosis not present

## 2019-12-31 DIAGNOSIS — Z0000 Encounter for general adult medical examination without abnormal findings: Secondary | ICD-10-CM | POA: Diagnosis not present

## 2019-12-31 DIAGNOSIS — Z131 Encounter for screening for diabetes mellitus: Secondary | ICD-10-CM

## 2019-12-31 DIAGNOSIS — R69 Illness, unspecified: Secondary | ICD-10-CM | POA: Diagnosis not present

## 2019-12-31 DIAGNOSIS — Z130 Encounter for screening for diseases of the blood and blood-forming organs and certain disorders involving the immune mechanism: Secondary | ICD-10-CM

## 2019-12-31 LAB — COMPREHENSIVE METABOLIC PANEL
ALT: 14 U/L (ref 0–35)
AST: 14 U/L (ref 0–37)
Albumin: 4.4 g/dL (ref 3.5–5.2)
Alkaline Phosphatase: 65 U/L (ref 39–117)
BUN: 12 mg/dL (ref 6–23)
CO2: 29 mEq/L (ref 19–32)
Calcium: 9.3 mg/dL (ref 8.4–10.5)
Chloride: 105 mEq/L (ref 96–112)
Creatinine, Ser: 0.65 mg/dL (ref 0.40–1.20)
GFR: 95.42 mL/min (ref >60.00)
Glucose, Bld: 98 mg/dL (ref 70–99)
Potassium: 4.2 mEq/L (ref 3.5–5.1)
Sodium: 138 mEq/L (ref 135–145)
Total Bilirubin: 0.5 mg/dL (ref 0.2–1.2)
Total Protein: 6.8 g/dL (ref 6.0–8.3)

## 2019-12-31 LAB — T3, FREE: T3, Free: 3.3 pg/mL (ref 2.3–4.2)

## 2019-12-31 LAB — CBC
HCT: 37 % (ref 36.0–46.0)
Hemoglobin: 12.5 g/dL (ref 12.0–15.0)
MCHC: 33.8 g/dL (ref 30.0–36.0)
MCV: 94.1 fl (ref 78.0–100.0)
Platelets: 238 10*3/uL (ref 150.0–400.0)
RBC: 3.93 Mil/uL (ref 3.87–5.11)
RDW: 13.4 % (ref 11.5–15.5)
WBC: 5.7 10*3/uL (ref 4.0–10.5)

## 2019-12-31 LAB — LIPID PANEL
Cholesterol: 169 mg/dL (ref 0–200)
HDL: 69 mg/dL (ref >39.00)
LDL Cholesterol: 86 mg/dL (ref 0–99)
NonHDL: 100.33
Total CHOL/HDL Ratio: 2
Triglycerides: 71 mg/dL (ref 0.0–149.0)
VLDL: 14.2 mg/dL (ref 0.0–40.0)

## 2019-12-31 LAB — HEMOGLOBIN A1C: Hgb A1c MFr Bld: 5.4 % (ref 4.6–6.5)

## 2019-12-31 LAB — TSH: TSH: 0.72 u[IU]/mL (ref 0.35–4.50)

## 2019-12-31 MED ORDER — VENLAFAXINE HCL ER 150 MG PO CP24: 150.0000 mg | ORAL_CAPSULE | Freq: Every day | ORAL | 3 refills | Status: DC

## 2019-12-31 MED ORDER — MONTELUKAST SODIUM 10 MG PO TABS: 10.0000 mg | ORAL_TABLET | Freq: Every day | ORAL | 3 refills | Status: DC

## 2019-12-31 MED ORDER — ATORVASTATIN CALCIUM 10 MG PO TABS: 10.0000 mg | ORAL_TABLET | Freq: Every day | ORAL | 0 refills | Status: DC

## 2020-01-01 ENCOUNTER — Encounter: Payer: Self-pay | Admitting: Family Medicine

## 2020-01-01 DIAGNOSIS — J3089 Other allergic rhinitis: Secondary | ICD-10-CM | POA: Diagnosis not present

## 2020-01-01 DIAGNOSIS — J3081 Allergic rhinitis due to animal (cat) (dog) hair and dander: Secondary | ICD-10-CM | POA: Diagnosis not present

## 2020-01-01 DIAGNOSIS — J301 Allergic rhinitis due to pollen: Secondary | ICD-10-CM | POA: Diagnosis not present

## 2020-01-01 LAB — THYROID PEROXIDASE ANTIBODY: Thyroperoxidase Ab SerPl-aCnc: 687 IU/mL — ABNORMAL HIGH (ref <9)

## 2020-01-09 ENCOUNTER — Encounter: Payer: Self-pay | Admitting: Family Medicine

## 2020-01-09 DIAGNOSIS — E079 Disorder of thyroid, unspecified: Secondary | ICD-10-CM

## 2020-01-10 NOTE — Telephone Encounter (Signed)
I have pended endocrinology referral for you. Im not sure which doctor you would like her to see.

## 2020-01-16 DIAGNOSIS — J3089 Other allergic rhinitis: Secondary | ICD-10-CM | POA: Diagnosis not present

## 2020-01-16 DIAGNOSIS — J3081 Allergic rhinitis due to animal (cat) (dog) hair and dander: Secondary | ICD-10-CM | POA: Diagnosis not present

## 2020-01-16 DIAGNOSIS — J301 Allergic rhinitis due to pollen: Secondary | ICD-10-CM | POA: Diagnosis not present

## 2020-01-21 DIAGNOSIS — J301 Allergic rhinitis due to pollen: Secondary | ICD-10-CM | POA: Diagnosis not present

## 2020-01-21 DIAGNOSIS — J3081 Allergic rhinitis due to animal (cat) (dog) hair and dander: Secondary | ICD-10-CM | POA: Diagnosis not present

## 2020-01-21 DIAGNOSIS — J3089 Other allergic rhinitis: Secondary | ICD-10-CM | POA: Diagnosis not present

## 2020-01-26 ENCOUNTER — Other Ambulatory Visit: Payer: Self-pay

## 2020-01-28 ENCOUNTER — Ambulatory Visit: Payer: 59 | Admitting: Internal Medicine

## 2020-01-28 NOTE — Progress Notes (Deleted)
Name: Misty [name]  MRN/ DOB: [id number], [birth year]    Age/ Sex: 54 y.o., female    PCP: Copland, Gay Filler, MD   Reason for Endocrinology Evaluation: Anti TPO Ab's     Date of Initial Endocrinology Evaluation: 01/28/2020     HPI: Misty [name] is a 54 y.o. female with a past medical history of Dyslipidemia, Hx of esophageal cancer , and Asthma. The patient presented for initial endocrinology clinic visit on 01/28/2020 for consultative assistance with her elevated Anti-TPO Ab's.   Pt was found to have elevated Anti-TPO Ab's during routine check up. Her TSh is normal at 0.72 uIU/mL as well as normal FT3 3.3 pg/mL   HISTORY:  Past Medical History:  Past Medical History:  Diagnosis Date  . Acne    from IUD/hormonal replacement  . Allergy    seasonal  . Anxiety   . Anxiety and depression   . Asthma   . Depression   . Esophageal ulcer    by EGD 08-03-2014  . GERD (gastroesophageal reflux disease)   . IUD (intrauterine device) in place   . Lactose intolerance   . Nonspecific abnormal results of thyroid function study   . Osteoarthritis    Dr.Collins   . Other and unspecified hyperlipidemia   . Thyroid disease     Past Surgical History:  Past Surgical History:  Procedure Laterality Date  . BILATERAL KNEE ARTHROSCOPY     Left: 11/10/2008, Right: 04/15/2009. Dr.Andy Collins  . G 1 P 1    . GANGLION CYST EXCISION     Right Wrist   . LEEP  03/15/2018  . SPIROMETRY  01/2016  . TONSILLECTOMY AND ADENOIDECTOMY        Social History:  reports that she has never smoked. She has never used smokeless tobacco. She reports current alcohol use of about 2.0 standard drinks of alcohol per week. She reports that she does not use drugs.  Family History: family history includes Breast cancer in her paternal grandmother; COPD in her mother; Celiac disease in her sister; Diabetes in her maternal aunt; Eczema in her daughter; Fibromyalgia in her sister; Heart attack (age of onset:  62) in her father; Heart attack (age of onset: 25) in her brother; Heart disease in her mother; Hyperlipidemia in her sister and sister; Hypertension in her father; Lung cancer in her maternal grandmother; Lung disease in her father; Osteopenia in her sister; Rheum arthritis in her sister; Stroke in her maternal grandfather.   HOME MEDICATIONS: Allergies as of 01/28/2020      Reactions   Lactose Intolerance (gi) Other (See Comments)   GI side effects   Latex    Makes face itchy   Penicillins    Unspecified reaction as an infant      Medication List       Accurate as of January 28, 2020  7:26 AM. If you have any questions, ask your nurse or doctor.        ADRENAL PO Take 1 tablet by mouth daily.   aspirin 81 MG tablet Take 81 mg by mouth daily. Reported on 02/23/2016   atorvastatin 10 MG tablet Commonly known as: LIPITOR Take 1 tablet (10 mg total) by mouth daily.   B-12 PO Take 1 tablet by mouth daily.   BIOTIN PO Take 1 tablet by mouth daily.   budesonide-formoterol 80-4.5 MCG/ACT inhaler Commonly known as: SYMBICORT Inhale 2 puffs into the lungs 2 (two) times daily.   CALCIUM  MAGNESIUM PO Take 1 tablet by mouth daily.   EpiPen 2-Pak 0.3 mg/0.3 mL Soaj injection Generic drug: EPINEPHrine AS DIRECTED AS NEEDED FOR SYSTEMIC REACTION INJECTION 30 DAYS   FISH OIL PO Take 1 tablet by mouth daily.   KELP PO Take 1 tablet by mouth daily.   loratadine 10 MG tablet Commonly known as: CLARITIN Take 10 mg by mouth as needed.   MAGNESIUM PO Take by mouth daily.   montelukast 10 MG tablet Commonly known as: SINGULAIR Take 1 tablet (10 mg total) by mouth at bedtime.   OVER THE COUNTER MEDICATION Pro-biotic 1 capsule daily.   ProAir HFA 108 (90 Base) MCG/ACT inhaler Generic drug: albuterol Inhale into the lungs every 6 (six) hours as needed for wheezing or shortness of breath.   TURMERIC PO Take 1 tablet by mouth daily.   venlafaxine XR 150 MG 24 hr  capsule Commonly known as: EFFEXOR-XR Take 1 capsule (150 mg total) by mouth daily with breakfast.   Vitamin D3 50 MCG (2000 UT) capsule Take 2,000 Units by mouth daily.         REVIEW OF SYSTEMS: A comprehensive ROS was conducted with the patient and is negative except as per HPI and below:  ROS     OBJECTIVE:  VS: There were no vitals taken for this visit.   Wt Readings from Last 3 Encounters:  12/31/19 213 lb (96.6 kg)  10/04/18 194 lb (88 kg)  03/15/18 198 lb (89.8 kg)     EXAM: General: Pt appears well and is in NAD  Hydration: Well-hydrated with moist mucous membranes and good skin turgor  Eyes: External eye exam normal without stare, lid lag or exophthalmos.  EOM intact.  PERRL.  Ears, Nose, Throat: Hearing: Grossly intact bilaterally Dental: Good dentition  Throat: Clear without mass, erythema or exudate  Neck: General: Supple without adenopathy. Thyroid: Thyroid size normal.  No goiter or nodules appreciated. No thyroid bruit.  Lungs: Clear with good BS bilat with no rales, rhonchi, or wheezes  Heart: Auscultation: RRR.  Abdomen: Normoactive bowel sounds, soft, nontender, without masses or organomegaly palpable  Extremities: Gait and station: Normal gait  Digits and nails: No clubbing, cyanosis, petechiae, or nodes Head and neck: Normal alignment and mobility BL UE: Normal ROM and strength. BL LE: No pretibial edema normal ROM and strength.  Skin: Hair: Texture and amount normal with gender appropriate distribution Skin Inspection: No rashes, acanthosis nigricans/skin tags. No lipohypertrophy Skin Palpation: Skin temperature, texture, and thickness normal to palpation  Neuro: Cranial nerves: II - XII grossly intact  Cerebellar: Normal coordination and movement; no tremor Motor: Normal strength throughout DTRs: 2+ and symmetric in UE without delay in relaxation phase  Mental Status: Judgment, insight: Intact Orientation: Oriented to time, place, and  person Memory: Intact for recent and remote events Mood and affect: No depression, anxiety, or agitation     DATA REVIEWED: ***    ASSESSMENT/PLAN/RECOMMENDATIONS:   1. Hashimoto's Disease:     Medications :  Signed electronically by: Mack Guise, MD  [facility] Endocrinology  [address] [place], [place] [address] Phone: [phone] FAX: [phone]   CC: Darreld Mclean, [address] [place] [address] Phone: [phone] Fax: [phone]   Return to Endocrinology clinic as below: Future Appointments  Date Time Provider [place]  01/28/2020  7:50 AM Roselene Gray, Melanie Crazier, MD [id number] None

## 2020-01-29 ENCOUNTER — Other Ambulatory Visit: Payer: Self-pay

## 2020-01-29 ENCOUNTER — Ambulatory Visit: Payer: 59 | Admitting: Internal Medicine

## 2020-01-29 ENCOUNTER — Encounter: Payer: Self-pay | Admitting: Internal Medicine

## 2020-01-29 VITALS — BP 128/82 | HR 87 | Temp 97.9°F | Ht 66.0 in | Wt 213.6 lb

## 2020-01-29 DIAGNOSIS — J3081 Allergic rhinitis due to animal (cat) (dog) hair and dander: Secondary | ICD-10-CM | POA: Diagnosis not present

## 2020-01-29 DIAGNOSIS — J3089 Other allergic rhinitis: Secondary | ICD-10-CM | POA: Diagnosis not present

## 2020-01-29 DIAGNOSIS — J301 Allergic rhinitis due to pollen: Secondary | ICD-10-CM | POA: Diagnosis not present

## 2020-01-29 DIAGNOSIS — E063 Autoimmune thyroiditis: Secondary | ICD-10-CM | POA: Insufficient documentation

## 2020-01-29 NOTE — Progress Notes (Signed)
Name: Misty [name]  MRN/ DOB: [id number], January 30, 1966    Age/ Sex: 54 y.o., female    PCP: Copland, Gay Filler, MD   Reason for Endocrinology Evaluation: Anti TPO Ab's     Date of Initial Endocrinology Evaluation: 01/29/2020     HPI: Ms. Misty [name] is a 54 y.o. female with a past medical history of Dyslipidemia, Hx of esophageal cancer , and Asthma. The patient presented for initial endocrinology clinic visit on 01/29/2020 for consultative assistance with her elevated Anti-TPO Ab's.   Pt was found to have elevated Anti-TPO Ab's during routine check up. Her TSh is normal at 0.72 uIU/mL as well as normal FT3 3.3 pg/mL   She was diagnosed with Hashimoto's disease back in 2017 when she was following up with wellness provider.  She was advised to start the adrenal supplements at the time.   Patient does endorse fatigue, occasional insomnia, and hair loss.   She does have family history of thyroid disease.    HISTORY:  Past Medical History:  Past Medical History:  Diagnosis Date  . Acne    from IUD/hormonal replacement  . Allergy    seasonal  . Anxiety   . Anxiety and depression   . Asthma   . Depression   . Esophageal ulcer    by EGD 08-03-2014  . GERD (gastroesophageal reflux disease)   . IUD (intrauterine device) in place   . Lactose intolerance   . Nonspecific abnormal results of thyroid function study   . Osteoarthritis    Dr.Collins   . Other and unspecified hyperlipidemia   . Thyroid disease    Past Surgical History:  Past Surgical History:  Procedure Laterality Date  . BILATERAL KNEE ARTHROSCOPY     Left: 11/10/2008, Right: 04/15/2009. Dr.Andy Collins  . G 1 P 1    . GANGLION CYST EXCISION     Right Wrist   . LEEP  03/15/2018  . SPIROMETRY  01/2016  . TONSILLECTOMY AND ADENOIDECTOMY        Social History:  reports that she has never smoked. She has never used smokeless tobacco. She reports current alcohol use of about 2.0 standard drinks of alcohol  per week. She reports that she does not use drugs.  Family History: family history includes Breast cancer in her paternal grandmother; COPD in her mother; Celiac disease in her sister; Diabetes in her maternal aunt; Eczema in her daughter; Fibromyalgia in her sister; Heart attack (age of onset: 48) in her father; Heart attack (age of onset: 56) in her brother; Heart disease in her mother; Hyperlipidemia in her sister and sister; Hypertension in her father; Lung cancer in her maternal grandmother; Lung disease in her father; Osteopenia in her sister; Rheum arthritis in her sister; Stroke in her maternal grandfather.   HOME MEDICATIONS: Allergies as of 01/29/2020      Reactions   Lactose Intolerance (gi) Other (See Comments)   GI side effects   Latex    Makes face itchy   Penicillins    Unspecified reaction as an infant      Medication List       Accurate as of January 29, 2020 10:59 AM. If you have any questions, ask your nurse or doctor.        ADRENAL PO Take 1 tablet by mouth daily.   aspirin 81 MG tablet Take 81 mg by mouth daily. Reported on 02/23/2016   atorvastatin 10 MG tablet Commonly known as: LIPITOR Take  1 tablet (10 mg total) by mouth daily.   B-12 PO Take 1 tablet by mouth daily.   BIOTIN PO Take 1 tablet by mouth daily.   budesonide-formoterol 80-4.5 MCG/ACT inhaler Commonly known as: SYMBICORT Inhale 2 puffs into the lungs 2 (two) times daily.   CALCIUM MAGNESIUM PO Take 1 tablet by mouth daily.   Collagen Hydrolysate Powd by Does not apply route.   EpiPen 2-Pak 0.3 mg/0.3 mL Soaj injection Generic drug: EPINEPHrine AS DIRECTED AS NEEDED FOR SYSTEMIC REACTION INJECTION 30 DAYS   FISH OIL PO Take 1 tablet by mouth daily.   KELP PO Take 1 tablet by mouth daily.   loratadine 10 MG tablet Commonly known as: CLARITIN Take 10 mg by mouth as needed.   MAGNESIUM PO Take by mouth daily.   montelukast 10 MG tablet Commonly known as: SINGULAIR Take 1  tablet (10 mg total) by mouth at bedtime.   NON FORMULARY phenasia gel for face   NON FORMULARY retna cream for face   OVER THE COUNTER MEDICATION Pro-biotic 1 capsule daily.   ProAir HFA 108 (90 Base) MCG/ACT inhaler Generic drug: albuterol Inhale into the lungs every 6 (six) hours as needed for wheezing or shortness of breath.   TURMERIC PO Take 1 tablet by mouth daily.   venlafaxine XR 150 MG 24 hr capsule Commonly known as: EFFEXOR-XR Take 1 capsule (150 mg total) by mouth daily with breakfast.   Vitamin D3 50 MCG (2000 UT) capsule Take 2,000 Units by mouth daily.   Voltaren 1 % Gel Generic drug: diclofenac Sodium Apply topically 4 (four) times daily.         REVIEW OF SYSTEMS: A comprehensive ROS was conducted with the patient and is negative except as per HPI     OBJECTIVE:  VS: BP 128/82 (BP Location: Left Arm, Patient Position: Sitting, Cuff Size: Large)   Pulse 87   Temp 97.9 F (36.6 C)   Ht 5\' 6"  (1.676 m)   Wt 213 lb 9.6 oz (96.9 kg)   SpO2 98%   BMI 34.48 kg/m    Wt Readings from Last 3 Encounters:  01/29/20 213 lb 9.6 oz (96.9 kg)  12/31/19 213 lb (96.6 kg)  10/04/18 194 lb (88 kg)     EXAM: General: Pt appears well and is in NAD  Eyes: External eye exam normal without stare, lid lag or exophthalmos.  EOM intact.   Neck: General: Supple without adenopathy. Thyroid: Thyroid size normal.  No goiter or nodules appreciated. No thyroid bruit.  Lungs: Clear with good BS bilat with no rales, rhonchi, or wheezes  Heart: Auscultation: RRR.  Abdomen: Normoactive bowel sounds, soft, nontender, without masses or organomegaly palpable  Extremities:  BL LE: No pretibial edema normal ROM and strength.  Skin: Hair: Texture and amount normal with gender appropriate distribution Skin Inspection: No rashes Skin Palpation: Skin temperature, texture, and thickness normal to palpation  Neuro: Cranial nerves: II - XII grossly intact  Motor: Normal strength  throughout DTRs: 2+ and symmetric in UE without delay in relaxation phase  Mental Status: Judgment, insight: Intact Orientation: Oriented to time, place, and person Mood and affect: No depression, anxiety, or agitation     DATA REVIEWED: Results for Misty [name], Misty [name] (MRN [id number]) as of 01/29/2020 10:59  Ref. Range 12/31/2019 11:31  TSH Latest Ref Range: 0.35 - 4.50 uIU/mL 0.72  Triiodothyronine,Free,Serum Latest Ref Range: 2.3 - 4.2 pg/mL 3.3  Thyroperoxidase Ab SerPl-aCnc Latest Ref Range: <9 IU/mL 687 (  H)      ASSESSMENT/PLAN/RECOMMENDATIONS:   1. Hashimoto's Disease:   -Patient with nonspecific symptoms that are not attributed to her thyroid disease as her thyroid function tests are normal. -No local neck symptoms -I explained to the patient that Hashimoto's Disease is an autoimmune - mediated destruction of the thyroid gland. The usual course of Hashimoto's thyroiditis is the gradual loss of thyroid function. Overt hypothyroidism occurs at a rate of ~ 5% per year.   -I have recommended annual thyroid function tests, or sooner if the patient is symptomatic. -I will be happy to see her again should her TFTs become abnormal   Follow-up as needed   Signed electronically by: Mack Guise, MD  [facility]  [address] [place], [place] [address] Phone: [phone] FAX: [phone]   CC: Darreld Mclean, [address] [place] [address] Phone: [phone] Fax: [phone]   Return to Endocrinology clinic as below: No future appointments.

## 2020-01-29 NOTE — Patient Instructions (Signed)
Hashimoto's Disease is an autoimmune - mediated destruction of the thyroid gland. The usual course of Hashimoto's thyroiditis is the gradual loss of thyroid function. Overt hypothyroidism occurs at a rate of ~ 5% per year.

## 2020-02-12 DIAGNOSIS — J3089 Other allergic rhinitis: Secondary | ICD-10-CM | POA: Diagnosis not present

## 2020-02-12 DIAGNOSIS — J3081 Allergic rhinitis due to animal (cat) (dog) hair and dander: Secondary | ICD-10-CM | POA: Diagnosis not present

## 2020-02-12 DIAGNOSIS — J301 Allergic rhinitis due to pollen: Secondary | ICD-10-CM | POA: Diagnosis not present

## 2020-02-25 DIAGNOSIS — J3089 Other allergic rhinitis: Secondary | ICD-10-CM | POA: Diagnosis not present

## 2020-02-25 DIAGNOSIS — J3081 Allergic rhinitis due to animal (cat) (dog) hair and dander: Secondary | ICD-10-CM | POA: Diagnosis not present

## 2020-02-25 DIAGNOSIS — J301 Allergic rhinitis due to pollen: Secondary | ICD-10-CM | POA: Diagnosis not present

## 2020-03-05 DIAGNOSIS — J3089 Other allergic rhinitis: Secondary | ICD-10-CM | POA: Diagnosis not present

## 2020-03-05 DIAGNOSIS — J301 Allergic rhinitis due to pollen: Secondary | ICD-10-CM | POA: Diagnosis not present

## 2020-03-05 DIAGNOSIS — J3081 Allergic rhinitis due to animal (cat) (dog) hair and dander: Secondary | ICD-10-CM | POA: Diagnosis not present

## 2020-03-17 DIAGNOSIS — D225 Melanocytic nevi of trunk: Secondary | ICD-10-CM | POA: Diagnosis not present

## 2020-03-17 DIAGNOSIS — L570 Actinic keratosis: Secondary | ICD-10-CM | POA: Diagnosis not present

## 2020-03-17 DIAGNOSIS — D2261 Melanocytic nevi of right upper limb, including shoulder: Secondary | ICD-10-CM | POA: Diagnosis not present

## 2020-03-17 DIAGNOSIS — D2262 Melanocytic nevi of left upper limb, including shoulder: Secondary | ICD-10-CM | POA: Diagnosis not present

## 2020-03-17 DIAGNOSIS — L814 Other melanin hyperpigmentation: Secondary | ICD-10-CM | POA: Diagnosis not present

## 2020-03-17 DIAGNOSIS — L905 Scar conditions and fibrosis of skin: Secondary | ICD-10-CM | POA: Diagnosis not present

## 2020-03-17 DIAGNOSIS — L821 Other seborrheic keratosis: Secondary | ICD-10-CM | POA: Diagnosis not present

## 2020-03-17 DIAGNOSIS — D485 Neoplasm of uncertain behavior of skin: Secondary | ICD-10-CM | POA: Diagnosis not present

## 2020-03-17 DIAGNOSIS — D2271 Melanocytic nevi of right lower limb, including hip: Secondary | ICD-10-CM | POA: Diagnosis not present

## 2020-03-17 DIAGNOSIS — L859 Epidermal thickening, unspecified: Secondary | ICD-10-CM | POA: Diagnosis not present

## 2020-03-31 DIAGNOSIS — J3089 Other allergic rhinitis: Secondary | ICD-10-CM | POA: Diagnosis not present

## 2020-03-31 DIAGNOSIS — J3081 Allergic rhinitis due to animal (cat) (dog) hair and dander: Secondary | ICD-10-CM | POA: Diagnosis not present

## 2020-03-31 DIAGNOSIS — J301 Allergic rhinitis due to pollen: Secondary | ICD-10-CM | POA: Diagnosis not present

## 2020-04-07 DIAGNOSIS — J3089 Other allergic rhinitis: Secondary | ICD-10-CM | POA: Diagnosis not present

## 2020-04-07 DIAGNOSIS — J3081 Allergic rhinitis due to animal (cat) (dog) hair and dander: Secondary | ICD-10-CM | POA: Diagnosis not present

## 2020-04-07 DIAGNOSIS — J301 Allergic rhinitis due to pollen: Secondary | ICD-10-CM | POA: Diagnosis not present

## 2020-04-22 DIAGNOSIS — J3081 Allergic rhinitis due to animal (cat) (dog) hair and dander: Secondary | ICD-10-CM | POA: Diagnosis not present

## 2020-04-22 DIAGNOSIS — J301 Allergic rhinitis due to pollen: Secondary | ICD-10-CM | POA: Diagnosis not present

## 2020-04-22 DIAGNOSIS — J3089 Other allergic rhinitis: Secondary | ICD-10-CM | POA: Diagnosis not present

## 2020-04-29 DIAGNOSIS — J301 Allergic rhinitis due to pollen: Secondary | ICD-10-CM | POA: Diagnosis not present

## 2020-04-29 DIAGNOSIS — J3089 Other allergic rhinitis: Secondary | ICD-10-CM | POA: Diagnosis not present

## 2020-04-29 DIAGNOSIS — J3081 Allergic rhinitis due to animal (cat) (dog) hair and dander: Secondary | ICD-10-CM | POA: Diagnosis not present

## 2020-05-13 DIAGNOSIS — J3081 Allergic rhinitis due to animal (cat) (dog) hair and dander: Secondary | ICD-10-CM | POA: Diagnosis not present

## 2020-05-13 DIAGNOSIS — J3089 Other allergic rhinitis: Secondary | ICD-10-CM | POA: Diagnosis not present

## 2020-05-13 DIAGNOSIS — J301 Allergic rhinitis due to pollen: Secondary | ICD-10-CM | POA: Diagnosis not present

## 2020-05-16 ENCOUNTER — Other Ambulatory Visit: Payer: Self-pay | Admitting: Family Medicine

## 2020-05-16 DIAGNOSIS — E7849 Other hyperlipidemia: Secondary | ICD-10-CM

## 2020-05-21 DIAGNOSIS — J3089 Other allergic rhinitis: Secondary | ICD-10-CM | POA: Diagnosis not present

## 2020-05-21 DIAGNOSIS — J3081 Allergic rhinitis due to animal (cat) (dog) hair and dander: Secondary | ICD-10-CM | POA: Diagnosis not present

## 2020-05-21 DIAGNOSIS — J301 Allergic rhinitis due to pollen: Secondary | ICD-10-CM | POA: Diagnosis not present

## 2020-06-16 DIAGNOSIS — J301 Allergic rhinitis due to pollen: Secondary | ICD-10-CM | POA: Diagnosis not present

## 2020-06-16 DIAGNOSIS — J3081 Allergic rhinitis due to animal (cat) (dog) hair and dander: Secondary | ICD-10-CM | POA: Diagnosis not present

## 2020-06-16 DIAGNOSIS — J3089 Other allergic rhinitis: Secondary | ICD-10-CM | POA: Diagnosis not present

## 2020-06-21 DIAGNOSIS — J3089 Other allergic rhinitis: Secondary | ICD-10-CM | POA: Diagnosis not present

## 2020-06-21 DIAGNOSIS — J4530 Mild persistent asthma, uncomplicated: Secondary | ICD-10-CM | POA: Diagnosis not present

## 2020-06-21 DIAGNOSIS — J3081 Allergic rhinitis due to animal (cat) (dog) hair and dander: Secondary | ICD-10-CM | POA: Diagnosis not present

## 2020-06-21 DIAGNOSIS — J301 Allergic rhinitis due to pollen: Secondary | ICD-10-CM | POA: Diagnosis not present

## 2020-06-23 DIAGNOSIS — J3081 Allergic rhinitis due to animal (cat) (dog) hair and dander: Secondary | ICD-10-CM | POA: Diagnosis not present

## 2020-06-23 DIAGNOSIS — J3089 Other allergic rhinitis: Secondary | ICD-10-CM | POA: Diagnosis not present

## 2020-06-23 DIAGNOSIS — J301 Allergic rhinitis due to pollen: Secondary | ICD-10-CM | POA: Diagnosis not present

## 2020-06-30 DIAGNOSIS — J3081 Allergic rhinitis due to animal (cat) (dog) hair and dander: Secondary | ICD-10-CM | POA: Diagnosis not present

## 2020-06-30 DIAGNOSIS — J3089 Other allergic rhinitis: Secondary | ICD-10-CM | POA: Diagnosis not present

## 2020-06-30 DIAGNOSIS — J301 Allergic rhinitis due to pollen: Secondary | ICD-10-CM | POA: Diagnosis not present

## 2020-07-14 DIAGNOSIS — J301 Allergic rhinitis due to pollen: Secondary | ICD-10-CM | POA: Diagnosis not present

## 2020-07-14 DIAGNOSIS — J3081 Allergic rhinitis due to animal (cat) (dog) hair and dander: Secondary | ICD-10-CM | POA: Diagnosis not present

## 2020-07-14 DIAGNOSIS — J3089 Other allergic rhinitis: Secondary | ICD-10-CM | POA: Diagnosis not present

## 2020-07-23 ENCOUNTER — Encounter: Payer: Self-pay | Admitting: Family Medicine

## 2020-07-23 DIAGNOSIS — J3089 Other allergic rhinitis: Secondary | ICD-10-CM | POA: Diagnosis not present

## 2020-07-23 DIAGNOSIS — J3081 Allergic rhinitis due to animal (cat) (dog) hair and dander: Secondary | ICD-10-CM | POA: Diagnosis not present

## 2020-07-23 DIAGNOSIS — J301 Allergic rhinitis due to pollen: Secondary | ICD-10-CM | POA: Diagnosis not present

## 2020-08-11 DIAGNOSIS — J3081 Allergic rhinitis due to animal (cat) (dog) hair and dander: Secondary | ICD-10-CM | POA: Diagnosis not present

## 2020-08-11 DIAGNOSIS — J301 Allergic rhinitis due to pollen: Secondary | ICD-10-CM | POA: Diagnosis not present

## 2020-08-11 DIAGNOSIS — J3089 Other allergic rhinitis: Secondary | ICD-10-CM | POA: Diagnosis not present

## 2020-11-04 DIAGNOSIS — M25562 Pain in left knee: Secondary | ICD-10-CM | POA: Diagnosis not present

## 2020-11-04 DIAGNOSIS — M25561 Pain in right knee: Secondary | ICD-10-CM | POA: Diagnosis not present

## 2020-11-04 DIAGNOSIS — M1712 Unilateral primary osteoarthritis, left knee: Secondary | ICD-10-CM | POA: Diagnosis not present

## 2020-11-04 DIAGNOSIS — M1711 Unilateral primary osteoarthritis, right knee: Secondary | ICD-10-CM | POA: Diagnosis not present

## 2020-11-14 ENCOUNTER — Other Ambulatory Visit: Payer: Self-pay | Admitting: Family Medicine

## 2020-11-14 DIAGNOSIS — E7849 Other hyperlipidemia: Secondary | ICD-10-CM

## 2020-11-16 ENCOUNTER — Other Ambulatory Visit: Payer: 59

## 2020-11-16 DIAGNOSIS — Z20822 Contact with and (suspected) exposure to covid-19: Secondary | ICD-10-CM | POA: Diagnosis not present

## 2020-11-17 LAB — NOVEL CORONAVIRUS, NAA: SARS-CoV-2, NAA: NOT DETECTED

## 2020-11-17 LAB — SARS-COV-2, NAA 2 DAY TAT

## 2020-12-20 ENCOUNTER — Encounter: Payer: Self-pay | Admitting: Family Medicine

## 2020-12-20 DIAGNOSIS — R079 Chest pain, unspecified: Secondary | ICD-10-CM

## 2020-12-20 DIAGNOSIS — R1310 Dysphagia, unspecified: Secondary | ICD-10-CM

## 2020-12-20 DIAGNOSIS — K920 Hematemesis: Secondary | ICD-10-CM

## 2020-12-20 DIAGNOSIS — R1319 Other dysphagia: Secondary | ICD-10-CM

## 2020-12-23 MED ORDER — PANTOPRAZOLE SODIUM 40 MG PO TBEC: 40.0000 mg | DELAYED_RELEASE_TABLET | Freq: Every day | ORAL | 6 refills | Status: DC

## 2020-12-26 NOTE — Progress Notes (Unsigned)
[facility] [address], [place], [place] [address] [phone]  Date:  12/29/2020   Name:  Misty [name]   DOB:  June 12, 1966   MRN:  [id number]  PCP:  Darreld Mclean, MD    Chief Complaint: Surgical Clearance (Knee Surgery clearance, right knee)   History of Present Illness:  Misty [name] is a 55 y.o. very pleasant female patient who presents with the following:  Patient today for preop clearance visit History of hyperlipidemia, esophageal ulcer, asthma, OA/ polyarthralgia treated by rheumatology/ Dr Theda Sers at [facility] Last seen by myself about 1 year ago, February 2021 She is going to have her right knee replaced on 2/21 Her knee holds her back from some aerobic activities-however, she does not have any CP or SOB with 4 mets of activity such as housework or carrying groceries She does have asthma but no history of any heart problems She occasionally uses her albuterol and does use symbicort on a regular basis  I have received preop clearance instructions from her orthopedist, they have requested an EKG as well as lab work to include INR, CMP, CBC with differential As she is due for physical, will do other routine labs as well today  COVID-19 series done including booster  Flu vaccine done  Shingrix is complete  Lipitor 10 Baby aspirin Venlafaxine Symbicort Patient Active Problem List   Diagnosis Date Noted  . Hashimoto's disease 01/29/2020  . FH: CAD (coronary artery disease) 08/25/2014  . Esophageal ulcer 08/03/2014  . *Depression 05/12/2009  . ECZEMA 05/12/2009  . OSTEOARTHRITIS 05/12/2009  . *Hyperlipidemia 03/26/2008  . 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 LACTOSE INTOLERANCE 03/24/2008  . Asthma, f/u by her allergist  03/24/2008  . 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 Acne    from IUD/hormonal replacement  . Allergy    seasonal  . Anxiety   .  Anxiety and depression   . Asthma   . Depression   . Esophageal ulcer    by EGD 08-03-2014  . GERD (gastroesophageal reflux disease)   . IUD (intrauterine device) in place   . Lactose intolerance   . Nonspecific abnormal results of thyroid function study   . Osteoarthritis    Dr.Collins   . Other and unspecified hyperlipidemia   . Thyroid disease     Past Surgical History:  Procedure Laterality Date  . BILATERAL KNEE ARTHROSCOPY     Left: 11/10/2008, Right: 04/15/2009. Dr.Andy Collins  . G 1 P 1    . GANGLION CYST EXCISION     Right Wrist   . LEEP  03/15/2018  . SPIROMETRY  01/2016  . TONSILLECTOMY AND ADENOIDECTOMY      Social History   Tobacco Use  . Smoking status: Never Smoker  . Smokeless tobacco: Never Used  Vaping Use  . Vaping Use: Never used  Substance Use Topics  . Alcohol use: Yes    Alcohol/week: 2.0 standard drinks    Types: 2 Glasses of wine per week    Comment: Socially  . Drug use: No    Family History  Problem Relation Age of Onset  . COPD Mother        smoker  . Heart disease Mother        stent @ 59; ablation for tachycardia  . Heart attack Father 70  . Lung disease Father  mesothelioma  . Hypertension Father   . Rheum arthritis Sister   . Hyperlipidemia Sister   . Diabetes Maternal Aunt   . Lung cancer Maternal Grandmother   . Stroke Maternal Grandfather   . Breast cancer Paternal Grandmother   . Osteopenia Sister   . Fibromyalgia Sister   . Hyperlipidemia Sister   . Celiac disease Sister   . Heart attack Brother 1  . Eczema Daughter   . Esophageal cancer Neg Hx   . Stomach cancer Neg Hx   . Colon cancer Neg Hx   . Rectal cancer Neg Hx     Allergies  Allergen Reactions  . Lactose Intolerance (Gi) Other (See Comments)    GI side effects  . Latex     Makes face itchy  . Penicillins     Unspecified reaction as an infant    Medication list has been reviewed and updated.  Current Outpatient Medications on File Prior  to Visit  Medication Sig Dispense Refill  . albuterol (VENTOLIN HFA) 108 (90 Base) MCG/ACT inhaler Inhale into the lungs every 6 (six) hours as needed for wheezing or shortness of breath.    Marland Kitchen aspirin 81 MG tablet Take 81 mg by mouth daily. Reported on 02/23/2016    . atorvastatin (LIPITOR) 10 MG tablet Take 1 tablet (10 mg total) by mouth daily. 90 tablet 1  . BIOTIN PO Take 1 tablet by mouth daily.     . budesonide-formoterol (SYMBICORT) 80-4.5 MCG/ACT inhaler Inhale 2 puffs into the lungs 2 (two) times daily.    . Calcium-Magnesium-Vitamin D (CALCIUM MAGNESIUM PO) Take 1 tablet by mouth daily.     . Cholecalciferol (VITAMIN D3) 2000 units capsule Take 2,000 Units by mouth daily.    . Collagen Hydrolysate POWD by Does not apply route.    . Cyanocobalamin (B-12 PO) Take 1 tablet by mouth daily.     . diclofenac Sodium (VOLTAREN) 1 % GEL Apply topically 4 (four) times daily.    Marland Kitchen EPIPEN 2-PAK 0.3 MG/0.3ML SOAJ injection AS DIRECTED AS NEEDED FOR SYSTEMIC REACTION INJECTION 30 DAYS  1  . Iodine, Kelp, (KELP PO) Take 1 tablet by mouth daily.     Marland Kitchen loratadine (CLARITIN) 10 MG tablet Take 10 mg by mouth as needed.     Marland Kitchen MAGNESIUM PO Take by mouth daily.    . Misc Natural Products (ADRENAL PO) Take 1 tablet by mouth daily.     . montelukast (SINGULAIR) 10 MG tablet Take 1 tablet (10 mg total) by mouth at bedtime. 90 tablet 3  . NON FORMULARY phenasia gel for face    . NON FORMULARY retna cream for face    . Omega-3 Fatty Acids (FISH OIL PO) Take 1 tablet by mouth daily.     Marland Kitchen OVER THE COUNTER MEDICATION Pro-biotic 1 capsule daily.    . pantoprazole (PROTONIX) 40 MG tablet Take 1 tablet (40 mg total) by mouth daily. 30 tablet 6  . TURMERIC PO Take 1 tablet by mouth daily.     Marland Kitchen venlafaxine XR (EFFEXOR-XR) 150 MG 24 hr capsule Take 1 capsule (150 mg total) by mouth daily with breakfast. 90 capsule 3   Current Facility-Administered Medications on File Prior to Visit  Medication Dose Route Frequency  Provider Last Rate Last Admin  . 0.9 %  sodium chloride infusion  500 mL Intravenous Continuous Pyrtle, Lajuan Lines, MD        Review of Systems:  As per HPI- otherwise negative.  Physical Examination: Vitals:   12/29/20 1423 12/29/20 1440  BP: (!) 148/98 140/90  Pulse: 98   Resp: 17   SpO2: 98%    Vitals:   12/29/20 1423  Weight: 228 lb (103.4 kg)  Height: 5\' 6"  (1.676 m)   Body mass index is 36.8 kg/m. Ideal Body Weight: Weight in (lb) to have BMI = 25: 154.6  GEN: no acute distress.  Overweight, otherwise looks well HEENT: Atraumatic, Normocephalic.  Ears and Nose: No external deformity. CV: RRR, No M/G/R. No JVD. No thrill. No extra heart sounds. PULM: CTA B, no wheezes, crackles, rhonchi. No retractions. No resp. distress. No accessory muscle use. ABD: S, NT, ND, +BS. No rebound. No HSM. EXTR: No c/c/e PSYCH: Normally interactive. Conversant.   EKG: NSR, compared with tracing from 2015 and no significant change noted   BP Readings from Last 3 Encounters:  12/29/20 140/90  01/29/20 128/82  12/31/19 136/84    Assessment and Plan: Preoperative clearance - Plan: CBC with Differential/Platelet, Comprehensive metabolic panel, Protime-INR, EKG 12-Lead  Encounter for hepatitis C screening test for low risk patient - Plan: Hepatitis C antibody  Screening for diabetes mellitus - Plan: Hemoglobin A1c  Hyperlipidemia, unspecified hyperlipidemia type - Plan: Lipid panel  Elevated blood pressure reading  Patient is here today for preoperative clearance, she plans to do right total knee in the near future EKG is normal, labs are pending as above Blood pressure slightly elevated today, the patient does not carry diagnosis of hypertension.  She works in the [employer].  She will have one of her coworkers checked her blood pressure a few times over the next 1 to 2 weeks.  If blood pressure continues to be above goal she will contact me and I can start her on a low-dose of  antihypertensive medication  Will plan further follow- up pending labs.  This visit occurred during the SARS-CoV-2 public health emergency.  Safety protocols were in place, including screening questions prior to the visit, additional usage of staff PPE, and extensive cleaning of exam room while observing appropriate contact time as indicated for disinfecting solutions.    Signed Lamar Blinks, MD   Received labs 2/10-  Message to pt  Results for orders placed or performed in visit on 12/29/20  CBC with Differential/Platelet  Result Value Ref Range   WBC 7.5 4.0 - 10.5 K/uL   RBC 4.03 3.87 - 5.11 Mil/uL   Hemoglobin 12.9 12.0 - 15.0 g/dL   HCT 38.4 36.0 - 46.0 %   MCV 95.4 78.0 - 100.0 fl   MCHC 33.7 30.0 - 36.0 g/dL   RDW 13.9 11.5 - 15.5 %   Platelets 255.0 150.0 - 400.0 K/uL   Neutrophils Relative % 69.5 43.0 - 77.0 %   Lymphocytes Relative 16.3 12.0 - 46.0 %   Monocytes Relative 6.4 3.0 - 12.0 %   Eosinophils Relative 7.0 (H) 0.0 - 5.0 %   Basophils Relative 0.8 0.0 - 3.0 %   Neutro Abs 5.2 1.4 - 7.7 K/uL   Lymphs Abs 1.2 0.7 - 4.0 K/uL   Monocytes Absolute 0.5 0.1 - 1.0 K/uL   Eosinophils Absolute 0.5 0.0 - 0.7 K/uL   Basophils Absolute 0.1 0.0 - 0.1 K/uL  Comprehensive metabolic panel  Result Value Ref Range   Sodium 140 135 - 145 mEq/L   Potassium 4.2 3.5 - 5.1 mEq/L   Chloride 103 96 - 112 mEq/L   CO2 31 19 - 32 mEq/L   Glucose,  Bld 81 70 - 99 mg/dL   BUN 14 6 - 23 mg/dL   Creatinine, Ser 0.81 0.40 - 1.20 mg/dL   Total Bilirubin 0.5 0.2 - 1.2 mg/dL   Alkaline Phosphatase 72 39 - 117 U/L   AST 14 0 - 37 U/L   ALT 18 0 - 35 U/L   Total Protein 6.5 6.0 - 8.3 g/dL   Albumin 4.2 3.5 - 5.2 g/dL   GFR 82.65 >60.00 mL/min   Calcium 9.1 8.4 - 10.5 mg/dL  Protime-INR  Result Value Ref Range   INR 1.0 0.8 - 1.0 ratio   Prothrombin Time 11.3 9.6 - 13.1 sec  Hepatitis C antibody  Result Value Ref Range   Hepatitis C Ab NON-REACTIVE NON-REACTI   SIGNAL TO CUT-OFF  0.03 <1.00  Hemoglobin A1c  Result Value Ref Range   Hgb A1c MFr Bld 5.6 4.6 - 6.5 %  Lipid panel  Result Value Ref Range   Cholesterol 184 0 - 200 mg/dL   Triglycerides 160.0 (H) 0.0 - 149.0 mg/dL   HDL 74.00 >39.00 mg/dL   VLDL 32.0 0.0 - 40.0 mg/dL   LDL Cholesterol 78 0 - 99 mg/dL   Total CHOL/HDL Ratio 2    NonHDL 109.64   '

## 2020-12-26 NOTE — Patient Instructions (Incomplete)
It was good to see you today, assuming your labs look okay we should be all set for surgery Your BP is a bit high today- please ask your nurse manager to recheck for you a few times over the next week or so.  If you continue to run high I can start you on some BP medication at least through your operation  BP goal less than 135/85

## 2020-12-29 ENCOUNTER — Ambulatory Visit: Payer: 59 | Admitting: Family Medicine

## 2020-12-29 ENCOUNTER — Encounter: Payer: Self-pay | Admitting: Family Medicine

## 2020-12-29 ENCOUNTER — Other Ambulatory Visit: Payer: Self-pay

## 2020-12-29 VITALS — BP 140/90 | HR 98 | Resp 17 | Ht 66.0 in | Wt 228.0 lb

## 2020-12-29 DIAGNOSIS — Z01818 Encounter for other preprocedural examination: Secondary | ICD-10-CM

## 2020-12-29 DIAGNOSIS — Z1159 Encounter for screening for other viral diseases: Secondary | ICD-10-CM

## 2020-12-29 DIAGNOSIS — E785 Hyperlipidemia, unspecified: Secondary | ICD-10-CM

## 2020-12-29 DIAGNOSIS — R030 Elevated blood-pressure reading, without diagnosis of hypertension: Secondary | ICD-10-CM | POA: Diagnosis not present

## 2020-12-29 DIAGNOSIS — Z131 Encounter for screening for diabetes mellitus: Secondary | ICD-10-CM

## 2020-12-30 ENCOUNTER — Telehealth: Payer: Self-pay | Admitting: Family Medicine

## 2020-12-30 ENCOUNTER — Encounter: Payer: Self-pay | Admitting: Family Medicine

## 2020-12-30 LAB — CBC WITH DIFFERENTIAL/PLATELET
Basophils Absolute: 0.1 10*3/uL (ref 0.0–0.1)
Basophils Relative: 0.8 % (ref 0.0–3.0)
Eosinophils Absolute: 0.5 10*3/uL (ref 0.0–0.7)
Eosinophils Relative: 7 % — ABNORMAL HIGH (ref 0.0–5.0)
HCT: 38.4 % (ref 36.0–46.0)
Hemoglobin: 12.9 g/dL (ref 12.0–15.0)
Lymphocytes Relative: 16.3 % (ref 12.0–46.0)
Lymphs Abs: 1.2 10*3/uL (ref 0.7–4.0)
MCHC: 33.7 g/dL (ref 30.0–36.0)
MCV: 95.4 fl (ref 78.0–100.0)
Monocytes Absolute: 0.5 10*3/uL (ref 0.1–1.0)
Monocytes Relative: 6.4 % (ref 3.0–12.0)
Neutro Abs: 5.2 10*3/uL (ref 1.4–7.7)
Neutrophils Relative %: 69.5 % (ref 43.0–77.0)
Platelets: 255 10*3/uL (ref 150.0–400.0)
RBC: 4.03 Mil/uL (ref 3.87–5.11)
RDW: 13.9 % (ref 11.5–15.5)
WBC: 7.5 10*3/uL (ref 4.0–10.5)

## 2020-12-30 LAB — PROTIME-INR
INR: 1 ratio (ref 0.8–1.0)
Prothrombin Time: 11.3 s (ref 9.6–13.1)

## 2020-12-30 LAB — LIPID PANEL
Cholesterol: 184 mg/dL (ref 0–200)
HDL: 74 mg/dL (ref >39.00)
LDL Cholesterol: 78 mg/dL (ref 0–99)
NonHDL: 109.64
Total CHOL/HDL Ratio: 2
Triglycerides: 160 mg/dL — ABNORMAL HIGH (ref 0.0–149.0)
VLDL: 32 mg/dL (ref 0.0–40.0)

## 2020-12-30 LAB — COMPREHENSIVE METABOLIC PANEL
ALT: 18 U/L (ref 0–35)
AST: 14 U/L (ref 0–37)
Albumin: 4.2 g/dL (ref 3.5–5.2)
Alkaline Phosphatase: 72 U/L (ref 39–117)
BUN: 14 mg/dL (ref 6–23)
CO2: 31 mEq/L (ref 19–32)
Calcium: 9.1 mg/dL (ref 8.4–10.5)
Chloride: 103 mEq/L (ref 96–112)
Creatinine, Ser: 0.81 mg/dL (ref 0.40–1.20)
GFR: 82.65 mL/min (ref >60.00)
Glucose, Bld: 81 mg/dL (ref 70–99)
Potassium: 4.2 mEq/L (ref 3.5–5.1)
Sodium: 140 mEq/L (ref 135–145)
Total Bilirubin: 0.5 mg/dL (ref 0.2–1.2)
Total Protein: 6.5 g/dL (ref 6.0–8.3)

## 2020-12-30 LAB — HEMOGLOBIN A1C: Hgb A1c MFr Bld: 5.6 % (ref 4.6–6.5)

## 2020-12-30 LAB — HEPATITIS C ANTIBODY
Hepatitis C Ab: NONREACTIVE
SIGNAL TO CUT-OFF: 0.03 (ref <1.00)

## 2020-12-30 NOTE — Telephone Encounter (Signed)
Misty [name] from [facility] is requesting results from EKG and lab work from yesterday to be faxed to [phone]

## 2020-12-30 NOTE — Telephone Encounter (Signed)
Results have been faxed

## 2020-12-31 ENCOUNTER — Encounter: Payer: Self-pay | Admitting: Family Medicine

## 2020-12-31 DIAGNOSIS — I10 Essential (primary) hypertension: Secondary | ICD-10-CM

## 2020-12-31 MED ORDER — LOSARTAN POTASSIUM 50 MG PO TABS: 50.0000 mg | ORAL_TABLET | Freq: Every day | ORAL | 3 refills | Status: DC

## 2021-01-04 DIAGNOSIS — M25561 Pain in right knee: Secondary | ICD-10-CM | POA: Diagnosis not present

## 2021-01-04 DIAGNOSIS — M1712 Unilateral primary osteoarthritis, left knee: Secondary | ICD-10-CM | POA: Diagnosis not present

## 2021-01-05 ENCOUNTER — Telehealth: Payer: Self-pay | Admitting: Family Medicine

## 2021-01-05 DIAGNOSIS — M1711 Unilateral primary osteoarthritis, right knee: Secondary | ICD-10-CM | POA: Diagnosis not present

## 2021-01-05 NOTE — Telephone Encounter (Signed)
Caller: Misty [name] [facility] Phone # [phone] Fax # [phone]  Patient is scheduled for right knee replacement surgery on Monday. They need the most recent office note fax.

## 2021-01-05 NOTE — Telephone Encounter (Signed)
Most recent OV note faxed

## 2021-01-10 DIAGNOSIS — G8918 Other acute postprocedural pain: Secondary | ICD-10-CM | POA: Diagnosis not present

## 2021-01-10 DIAGNOSIS — M1711 Unilateral primary osteoarthritis, right knee: Secondary | ICD-10-CM | POA: Diagnosis not present

## 2021-01-21 DIAGNOSIS — M25561 Pain in right knee: Secondary | ICD-10-CM | POA: Diagnosis not present

## 2021-01-21 DIAGNOSIS — Z7409 Other reduced mobility: Secondary | ICD-10-CM | POA: Diagnosis not present

## 2021-01-25 DIAGNOSIS — Z7409 Other reduced mobility: Secondary | ICD-10-CM | POA: Diagnosis not present

## 2021-01-25 DIAGNOSIS — M25561 Pain in right knee: Secondary | ICD-10-CM | POA: Diagnosis not present

## 2021-01-27 DIAGNOSIS — M25561 Pain in right knee: Secondary | ICD-10-CM | POA: Diagnosis not present

## 2021-01-27 DIAGNOSIS — Z7409 Other reduced mobility: Secondary | ICD-10-CM | POA: Diagnosis not present

## 2021-01-27 DIAGNOSIS — L239 Allergic contact dermatitis, unspecified cause: Secondary | ICD-10-CM | POA: Diagnosis not present

## 2021-02-01 DIAGNOSIS — Z7409 Other reduced mobility: Secondary | ICD-10-CM | POA: Diagnosis not present

## 2021-02-01 DIAGNOSIS — M25561 Pain in right knee: Secondary | ICD-10-CM | POA: Diagnosis not present

## 2021-02-03 DIAGNOSIS — Z7409 Other reduced mobility: Secondary | ICD-10-CM | POA: Diagnosis not present

## 2021-02-03 DIAGNOSIS — M25561 Pain in right knee: Secondary | ICD-10-CM | POA: Diagnosis not present

## 2021-02-08 DIAGNOSIS — M25561 Pain in right knee: Secondary | ICD-10-CM | POA: Diagnosis not present

## 2021-02-08 DIAGNOSIS — Z7409 Other reduced mobility: Secondary | ICD-10-CM | POA: Diagnosis not present

## 2021-02-10 DIAGNOSIS — Z7409 Other reduced mobility: Secondary | ICD-10-CM | POA: Diagnosis not present

## 2021-02-10 DIAGNOSIS — M25561 Pain in right knee: Secondary | ICD-10-CM | POA: Diagnosis not present

## 2021-02-11 ENCOUNTER — Encounter: Payer: Self-pay | Admitting: Internal Medicine

## 2021-02-11 DIAGNOSIS — M1711 Unilateral primary osteoarthritis, right knee: Secondary | ICD-10-CM | POA: Diagnosis not present

## 2021-02-13 ENCOUNTER — Other Ambulatory Visit: Payer: Self-pay | Admitting: Family Medicine

## 2021-02-13 DIAGNOSIS — F325 Major depressive disorder, single episode, in full remission: Secondary | ICD-10-CM

## 2021-02-15 DIAGNOSIS — Z7409 Other reduced mobility: Secondary | ICD-10-CM | POA: Diagnosis not present

## 2021-02-15 DIAGNOSIS — M25561 Pain in right knee: Secondary | ICD-10-CM | POA: Diagnosis not present

## 2021-02-17 DIAGNOSIS — Z6838 Body mass index (BMI) 38.0-38.9, adult: Secondary | ICD-10-CM | POA: Diagnosis not present

## 2021-02-17 DIAGNOSIS — N950 Postmenopausal bleeding: Secondary | ICD-10-CM | POA: Diagnosis not present

## 2021-02-17 DIAGNOSIS — Z01419 Encounter for gynecological examination (general) (routine) without abnormal findings: Secondary | ICD-10-CM | POA: Diagnosis not present

## 2021-02-17 DIAGNOSIS — Z87410 Personal history of cervical dysplasia: Secondary | ICD-10-CM | POA: Diagnosis not present

## 2021-02-17 DIAGNOSIS — Z13820 Encounter for screening for osteoporosis: Secondary | ICD-10-CM | POA: Diagnosis not present

## 2021-02-17 DIAGNOSIS — N959 Unspecified menopausal and perimenopausal disorder: Secondary | ICD-10-CM | POA: Diagnosis not present

## 2021-02-17 DIAGNOSIS — Z1231 Encounter for screening mammogram for malignant neoplasm of breast: Secondary | ICD-10-CM | POA: Diagnosis not present

## 2021-02-23 DIAGNOSIS — M25561 Pain in right knee: Secondary | ICD-10-CM | POA: Diagnosis not present

## 2021-02-25 DIAGNOSIS — M25561 Pain in right knee: Secondary | ICD-10-CM | POA: Diagnosis not present

## 2021-02-25 DIAGNOSIS — M25562 Pain in left knee: Secondary | ICD-10-CM | POA: Diagnosis not present

## 2021-02-26 NOTE — Progress Notes (Signed)
[place] at [facility] [address] [place], [place] [address] [phone]  Date:  02/28/2021   Name:  Misty [name]   DOB:  01-09-1966   MRN:  [id number]  PCP:  Darreld Mclean, MD    Chief Complaint: Palpitations and Medication Management   History of Present Illness:  Misty [name] is a 55 y.o. very pleasant female patient who presents with the following:  Following up today-would like to discuss medication for weight loss Last seen by myself in February to clear for knee replacement surgery History of hyperlipidemia, esophageal ulcer, asthma, OA/ polyarthralgia treated by rheumatology/ Dr Theda Sers at [facility]- surgery done 2/21  She has been doing PT for her knee - about to finish up treatments this week She does have some neuropathy in her foot but OW she has done great She tried some gabapentin- she may take 100 mg at bedtime as needed for sleep  Here today with concern of obesity, she is concerned about her weight and would like to lose.  She wonders about trying [place] She has tried to lose weight but this seems to keep getting harder  There is family history of CAD and DM which further motivates her to lose weight No personal or family history of medullary thyroid carcinoma or MPN disorder, no history of pancreatitis She is comfortable with the idea of giving her injections  Lab Results  Component Value Date   HGBA1C 5.6 12/29/2020     Wt Readings from Last 3 Encounters:  02/28/21 232 lb (105.2 kg)  12/29/20 228 lb (103.4 kg)  01/29/20 213 lb 9.6 oz (96.9 kg)     BP Readings from Last 3 Encounters:  02/28/21 118/72  12/29/20 140/90  01/29/20 128/82   Also, she will occasionally notice a brief period of irregular heartbeat/palpitations.  This tends to occur most frequently when she is sitting quietly while lying in bed at night.  She has not had any chest pain or exertional symptoms.  We did an  EKG preop which was reassuring/normal.  She would be interested in doing a Zio patch monitor in hopes of uncovering the cause of her palpitations.  We can certainly set this up  Lab Results  Component Value Date   TSH 0.72 12/31/2019   Today how are you  Patient Active Problem List   Diagnosis Date Noted  . Hashimoto's disease 01/29/2020  . FH: CAD (coronary artery disease) 08/25/2014  . Esophageal ulcer 08/03/2014  . *Depression 05/12/2009  . ECZEMA 05/12/2009  . OSTEOARTHRITIS 05/12/2009  . *Hyperlipidemia 03/26/2008  . 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 LACTOSE INTOLERANCE 03/24/2008  . Asthma, f/u by her allergist  03/24/2008  . 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 Acne    from IUD/hormonal replacement  . Allergy    seasonal  . Anxiety   . Anxiety and depression   . Asthma   . Depression   . Esophageal ulcer    by EGD 08-03-2014  . GERD (gastroesophageal reflux disease)   . IUD (intrauterine device) in place   . Lactose intolerance   . Nonspecific abnormal results of thyroid function study   . Osteoarthritis    Dr.Collins   . Other and unspecified hyperlipidemia   . Thyroid disease     Past Surgical History:  Procedure Laterality Date  . BILATERAL KNEE ARTHROSCOPY     Left: 11/10/2008, Right:  04/15/2009. Dr.Andy Collins  . G 1 P 1    . GANGLION CYST EXCISION     Right Wrist   . LEEP  03/15/2018  . SPIROMETRY  01/2016  . TONSILLECTOMY AND ADENOIDECTOMY      Social History   Tobacco Use  . Smoking status: Never Smoker  . Smokeless tobacco: Never Used  Vaping Use  . Vaping Use: Never used  Substance Use Topics  . Alcohol use: Yes    Alcohol/week: 2.0 standard drinks    Types: 2 Glasses of wine per week    Comment: Socially  . Drug use: No    Family History  Problem Relation Age of Onset  . COPD Mother        smoker  . Heart disease Mother        stent @ 67; ablation for tachycardia  . Heart attack Father  56  . Lung disease Father        mesothelioma  . Hypertension Father   . Rheum arthritis Sister   . Hyperlipidemia Sister   . Diabetes Maternal Aunt   . Lung cancer Maternal Grandmother   . Stroke Maternal Grandfather   . Breast cancer Paternal Grandmother   . Osteopenia Sister   . Fibromyalgia Sister   . Hyperlipidemia Sister   . Celiac disease Sister   . Heart attack Brother 46  . Eczema Daughter   . Esophageal cancer Neg Hx   . Stomach cancer Neg Hx   . Colon cancer Neg Hx   . Rectal cancer Neg Hx     Allergies  Allergen Reactions  . Lactose Intolerance (Gi) Other (See Comments)    GI side effects  . Latex     Makes face itchy  . Penicillins     Unspecified reaction as an infant    Medication list has been reviewed and updated.  Current Outpatient Medications on File Prior to Visit  Medication Sig Dispense Refill  . albuterol (VENTOLIN HFA) 108 (90 Base) MCG/ACT inhaler Inhale into the lungs every 6 (six) hours as needed for wheezing or shortness of breath.    Marland Kitchen aspirin 81 MG tablet Take 81 mg by mouth daily. Reported on 02/23/2016    . atorvastatin (LIPITOR) 10 MG tablet Take 1 tablet (10 mg total) by mouth daily. 90 tablet 1  . BIOTIN PO Take 1 tablet by mouth daily.     . budesonide-formoterol (SYMBICORT) 80-4.5 MCG/ACT inhaler Inhale 2 puffs into the lungs 2 (two) times daily.    . Calcium-Magnesium-Vitamin D (CALCIUM MAGNESIUM PO) Take 1 tablet by mouth daily.     . Cholecalciferol (VITAMIN D3) 2000 units capsule Take 2,000 Units by mouth daily.    . Collagen Hydrolysate POWD by Does not apply route.    . Cyanocobalamin (B-12 PO) Take 1 tablet by mouth daily.     . diclofenac Sodium (VOLTAREN) 1 % GEL Apply topically 4 (four) times daily.    Marland Kitchen EPIPEN 2-PAK 0.3 MG/0.3ML SOAJ injection AS DIRECTED AS NEEDED FOR SYSTEMIC REACTION INJECTION 30 DAYS  1  . Iodine, Kelp, (KELP PO) Take 1 tablet by mouth daily.     Marland Kitchen loratadine (CLARITIN) 10 MG tablet Take 10 mg by mouth  as needed.     Marland Kitchen losartan (COZAAR) 50 MG tablet Take 1 tablet (50 mg total) by mouth daily. 30 tablet 3  . MAGNESIUM PO Take by mouth daily.    . Misc Natural Products (ADRENAL PO) Take 1 tablet by mouth  daily.     . montelukast (SINGULAIR) 10 MG tablet Take 1 tablet (10 mg total) by mouth at bedtime. 90 tablet 3  . NON FORMULARY phenasia gel for face    . NON FORMULARY retna cream for face    . Omega-3 Fatty Acids (FISH OIL PO) Take 1 tablet by mouth daily.     Marland Kitchen OVER THE COUNTER MEDICATION Pro-biotic 1 capsule daily.    . pantoprazole (PROTONIX) 40 MG tablet Take 1 tablet (40 mg total) by mouth daily. 30 tablet 6  . TURMERIC PO Take 1 tablet by mouth daily.     Marland Kitchen venlafaxine XR (EFFEXOR-XR) 150 MG 24 hr capsule TAKE 1 CAPSULE (150 MG TOTAL) BY MOUTH DAILY WITH BREAKFAST. 90 capsule 3   Current Facility-Administered Medications on File Prior to Visit  Medication Dose Route Frequency Provider Last Rate Last Admin  . 0.9 %  sodium chloride infusion  500 mL Intravenous Continuous Pyrtle, Lajuan Lines, MD        Review of Systems:  As per HPI- otherwise negative.   Physical Examination: Vitals:   02/28/21 1532  BP: 118/72  Pulse: (!) 104  Resp: 16  Temp: 97.7 F (36.5 C)  SpO2: 99%   Vitals:   02/28/21 1532  Weight: 232 lb (105.2 kg)  Height: 5\' 6"  (1.676 m)   Body mass index is 37.45 kg/m. Ideal Body Weight: Weight in (lb) to have BMI = 25: 154.6  GEN: no acute distress.  Obese, otherwise looks well HEENT: Atraumatic, Normocephalic.  Ears and Nose: No external deformity. CV: RRR, No M/G/R. No JVD. No thrill. No extra heart sounds. PULM: CTA B, no wheezes, crackles, rhonchi. No retractions. No resp. distress. No accessory muscle use. ABD: S, NT, ND, +BS. No rebound. No HSM. EXTR: No c/c/e PSYCH: Normally interactive. Conversant.    Assessment and Plan: Morbid obesity ([place]) - Plan: Liraglutide -Weight Management (SAXENDA) 18 MG/3ML SOPN, Insulin Pen Needle (PEN NEEDLES) 32G  X 5 MM MISC  Palpitations - Plan: LONG TERM MONITOR (3-14 DAYS)  Here today to discuss treatment for obesity.  She is interested in using [place].  I do not see any contraindications, we went over how to use medication and how to titrate today.  I asked her to see me about 3 months for follow-up-will contact me sooner if any concerns We also ordered a Zio patch monitor for history of palpitations  This visit occurred during the SARS-CoV-2 public health emergency.  Safety protocols were in place, including screening questions prior to the visit, additional usage of staff PPE, and extensive cleaning of exam room while observing appropriate contact time as indicated for disinfecting solutions.    Signed Lamar Blinks, MD

## 2021-02-28 ENCOUNTER — Encounter: Payer: Self-pay | Admitting: Family Medicine

## 2021-02-28 ENCOUNTER — Ambulatory Visit (INDEPENDENT_AMBULATORY_CARE_PROVIDER_SITE_OTHER): Payer: 59 | Admitting: Family Medicine

## 2021-02-28 ENCOUNTER — Other Ambulatory Visit: Payer: Self-pay

## 2021-02-28 DIAGNOSIS — R002 Palpitations: Secondary | ICD-10-CM | POA: Diagnosis not present

## 2021-02-28 MED ORDER — PEN NEEDLES 32G X 5 MM MISC: 2 refills | Status: DC

## 2021-02-28 MED ORDER — SAXENDA 18 MG/3ML ~~LOC~~ SOPN: 3.0000 mg | PEN_INJECTOR | Freq: Every day | SUBCUTANEOUS | 3 refills | Status: DC

## 2021-02-28 NOTE — Patient Instructions (Signed)
It was great to see you again today   -We will start you on Saxenda for weight control.  You will inject this once daily.  Please start with 0.6 mg daily for 1 week, then increase by 0.6 weekly-1.2 mg, 1.8 mg, 2.4 mg, to a goal dose of 3 mg daily  Please let me know if any problems or difficulties using the medication  I will also set you up for a heart monitor to wear for 5 to 7 days-we will try to determine the source of your palpitations

## 2021-03-01 ENCOUNTER — Ambulatory Visit: Payer: 59

## 2021-03-01 DIAGNOSIS — R002 Palpitations: Secondary | ICD-10-CM

## 2021-03-02 ENCOUNTER — Ambulatory Visit: Payer: 59 | Admitting: Family Medicine

## 2021-03-02 DIAGNOSIS — M25561 Pain in right knee: Secondary | ICD-10-CM | POA: Diagnosis not present

## 2021-03-08 DIAGNOSIS — D2262 Melanocytic nevi of left upper limb, including shoulder: Secondary | ICD-10-CM | POA: Diagnosis not present

## 2021-03-08 DIAGNOSIS — L812 Freckles: Secondary | ICD-10-CM | POA: Diagnosis not present

## 2021-03-08 DIAGNOSIS — L821 Other seborrheic keratosis: Secondary | ICD-10-CM | POA: Diagnosis not present

## 2021-03-08 DIAGNOSIS — D225 Melanocytic nevi of trunk: Secondary | ICD-10-CM | POA: Diagnosis not present

## 2021-03-08 DIAGNOSIS — L814 Other melanin hyperpigmentation: Secondary | ICD-10-CM | POA: Diagnosis not present

## 2021-03-08 DIAGNOSIS — D2271 Melanocytic nevi of right lower limb, including hip: Secondary | ICD-10-CM | POA: Diagnosis not present

## 2021-03-17 DIAGNOSIS — Z96651 Presence of right artificial knee joint: Secondary | ICD-10-CM | POA: Diagnosis not present

## 2021-03-17 DIAGNOSIS — M1712 Unilateral primary osteoarthritis, left knee: Secondary | ICD-10-CM | POA: Diagnosis not present

## 2021-03-25 ENCOUNTER — Encounter: Payer: Self-pay | Admitting: *Deleted

## 2021-03-29 ENCOUNTER — Encounter: Payer: Self-pay | Admitting: Internal Medicine

## 2021-03-29 ENCOUNTER — Ambulatory Visit (INDEPENDENT_AMBULATORY_CARE_PROVIDER_SITE_OTHER): Payer: 59 | Admitting: Internal Medicine

## 2021-03-29 VITALS — BP 126/70 | HR 92 | Ht 66.0 in | Wt 224.0 lb

## 2021-03-29 DIAGNOSIS — Z86010 Personal history of colonic polyps: Secondary | ICD-10-CM | POA: Diagnosis not present

## 2021-03-29 DIAGNOSIS — K219 Gastro-esophageal reflux disease without esophagitis: Secondary | ICD-10-CM

## 2021-03-29 DIAGNOSIS — G4710 Hypersomnia, unspecified: Secondary | ICD-10-CM | POA: Diagnosis not present

## 2021-03-29 NOTE — Progress Notes (Signed)
Patient ID: Misty [name], female   DOB: 1966/11/03, 55 y.o.   MRN: [id number] HPI: Misty [name] is a 55 year old female with a history of GERD, history of esophageal ulceration versus mucosal tear in 2015, history of sessile serrated colon polyp who is seen in consultation at the request of Dr. Lorelei Pont to evaluate uncontrolled reflux symptoms.  She is here alone today.  She also has a history of asthma, eczema, dyslipidemia and obesity.  Within the last month she started liraglutide for weight loss.  She reports that recently she has had issues with heartburn, indigestion symptom.  At times the heartburn could be so severe that it would lead to vomiting.  She found herself using Tums periodically and then over the last several months using Tums on a much more frequent basis.  She was started back on pantoprazole which she has been using 40 mg daily.  This has been helpful but not completely controlling heartburn.  She is also noticed with the liraglutide that she has had slower digestion cysts, some early fullness type symptom as well as some mild constipation.  She wonders if her reflux has not worsened somewhat with this new medication though she would like to keep it going because she wishes to lose weight.  She has gained about 20 pounds in the last year but even more than that over the last several years.  She does not have abdominal pain.  Bowel movements have been somewhat less frequent but no blood in stool or melena.  She had total knee replacement in February 2022.  Of note she had an upper endoscopy which I performed on 08/03/2014.  She noticed after taking medication 1 morning that she developed acute chest pain and had dysphagia with odynophagia.  This also led to an episode of hematemesis.  Upper endoscopy was performed on 08/03/2014 and showed a single nonbleeding linear ulceration versus mucosal tear over a 7 to 8 cm segment of the mid esophagus.  There was a 2 cm hiatal hernia noted.  There  was a question of a nonobstructing Schatzki's ring.  The stomach and duodenum were normal.  She was treated with PPI after this was discovered but did not remain on PPI long-term.  Past Medical History:  Diagnosis Date  . Acne    from IUD/hormonal replacement  . Allergy    seasonal  . Anxiety   . Anxiety and depression   . Asthma   . Depression   . Diverticulosis   . Esophageal ulcer    by EGD 08-03-2014  . GERD (gastroesophageal reflux disease)   . Hiatal hernia   . IUD (intrauterine device) in place   . Lactose intolerance   . Nonspecific abnormal results of thyroid function study   . Osteoarthritis    Dr.Collins   . Other and unspecified hyperlipidemia   . Sessile colonic polyp   . Thyroid disease     Past Surgical History:  Procedure Laterality Date  . BILATERAL KNEE ARTHROSCOPY     Left: 11/10/2008, Right: 04/15/2009. Dr.Andy Collins  . G 1 P 1    . GANGLION CYST EXCISION     Right Wrist   . LEEP  03/15/2018  . SPIROMETRY  01/2016  . TONSILLECTOMY AND ADENOIDECTOMY      Outpatient Medications Prior to Visit  Medication Sig Dispense Refill  . albuterol (VENTOLIN HFA) 108 (90 Base) MCG/ACT inhaler Inhale into the lungs every 6 (six) hours as needed for wheezing or shortness of breath.    Marland Kitchen  aspirin 81 MG tablet Take 81 mg by mouth daily. Reported on 02/23/2016    . atorvastatin (LIPITOR) 10 MG tablet Take 1 tablet (10 mg total) by mouth daily. 90 tablet 1  . BIOTIN PO Take 1 tablet by mouth daily.     . budesonide-formoterol (SYMBICORT) 80-4.5 MCG/ACT inhaler Inhale 2 puffs into the lungs 2 (two) times daily.    . Calcium-Magnesium-Vitamin D (CALCIUM MAGNESIUM PO) Take 1 tablet by mouth daily.     . Cholecalciferol (VITAMIN D3) 2000 units capsule Take 2,000 Units by mouth daily.    . Cyanocobalamin (B-12 PO) Take 1 tablet by mouth daily.     Marland Kitchen EPIPEN 2-PAK 0.3 MG/0.3ML SOAJ injection AS DIRECTED AS NEEDED FOR SYSTEMIC REACTION INJECTION 30 DAYS  1  . Insulin Pen Needle  (PEN NEEDLES) 32G X 5 MM MISC Use one daily with saxenda 100 each 2  . Liraglutide -Weight Management (SAXENDA) 18 MG/3ML SOPN Inject 3 mg into the skin daily. Start at 0.6 mg and increase by 0.6 mg weekly to goal dose of 3 mg 15 mL 3  . loratadine (CLARITIN) 10 MG tablet Take 10 mg by mouth as needed.     Marland Kitchen losartan (COZAAR) 50 MG tablet Take 1 tablet (50 mg total) by mouth daily. 30 tablet 3  . MAGNESIUM PO Take by mouth daily.    . Misc Natural Products (ADRENAL PO) Take 1 tablet by mouth daily.     . montelukast (SINGULAIR) 10 MG tablet Take 1 tablet (10 mg total) by mouth at bedtime. 90 tablet 3  . NON FORMULARY phenasia gel for face    . NON FORMULARY retna cream for face    . Omega-3 Fatty Acids (FISH OIL PO) Take 1 tablet by mouth daily.     Marland Kitchen OVER THE COUNTER MEDICATION Pro-biotic 1 capsule daily.    . pantoprazole (PROTONIX) 40 MG tablet Take 1 tablet (40 mg total) by mouth daily. 30 tablet 6  . venlafaxine XR (EFFEXOR-XR) 150 MG 24 hr capsule TAKE 1 CAPSULE (150 MG TOTAL) BY MOUTH DAILY WITH BREAKFAST. 90 capsule 3  . Collagen Hydrolysate POWD by Does not apply route. (Patient not taking: Reported on 03/29/2021)    . diclofenac Sodium (VOLTAREN) 1 % GEL Apply topically 4 (four) times daily. (Patient not taking: Reported on 03/29/2021)    . Iodine, Kelp, (KELP PO) Take 1 tablet by mouth daily.  (Patient not taking: Reported on 03/29/2021)    . TURMERIC PO Take 1 tablet by mouth daily.  (Patient not taking: Reported on 03/29/2021)     Facility-Administered Medications Prior to Visit  Medication Dose Route Frequency Provider Last Rate Last Admin  . 0.9 %  sodium chloride infusion  500 mL Intravenous Continuous Toshiro Hanken, Lajuan Lines, MD        Allergies  Allergen Reactions  . Lactose Intolerance (Gi) Other (See Comments)    GI side effects  . Latex     Makes face itchy  . Penicillins     Unspecified reaction as an infant    Family History  Problem Relation Age of Onset  . COPD Mother         smoker  . Heart disease Mother        stent @ 44; ablation for tachycardia  . Heart attack Father 83  . Lung disease Father        mesothelioma  . Hypertension Father   . Rheum arthritis Sister   . Hyperlipidemia Sister   .  Diabetes Maternal Aunt   . Lung cancer Maternal Grandmother   . Stroke Maternal Grandfather   . Breast cancer Paternal Grandmother   . Osteopenia Sister   . Fibromyalgia Sister   . Hyperlipidemia Sister   . Celiac disease Sister   . Heart attack Brother 65  . Eczema Daughter   . Esophageal cancer Neg Hx   . Stomach cancer Neg Hx   . Colon cancer Neg Hx   . Rectal cancer Neg Hx     Social History   Tobacco Use  . Smoking status: Never Smoker  . Smokeless tobacco: Never Used  Vaping Use  . Vaping Use: Never used  Substance Use Topics  . Alcohol use: Yes    Alcohol/week: 2.0 standard drinks    Types: 2 Glasses of wine per week    Comment: Socially  . Drug use: No    ROS: As per history of present illness, otherwise negative  BP 126/70   Pulse 92   Ht 5\' 6"  (1.676 m)   Wt 224 lb (101.6 kg)   SpO2 98%   BMI 36.15 kg/m  Gen: awake, alert, NAD HEENT: anicteric CV: RRR, no mrg Pulm: CTA b/l Abd: soft, NT/ND, +BS throughout Ext: no c/c/e Neuro: nonfocal   RELEVANT LABS AND IMAGING: CBC    Component Value Date/Time   WBC 7.5 12/29/2020 1451   RBC 4.03 12/29/2020 1451   HGB 12.9 12/29/2020 1451   HCT 38.4 12/29/2020 1451   PLT 255.0 12/29/2020 1451   MCV 95.4 12/29/2020 1451   MCH 31.6 07/30/2014 2016   MCHC 33.7 12/29/2020 1451   RDW 13.9 12/29/2020 1451   LYMPHSABS 1.2 12/29/2020 1451   MONOABS 0.5 12/29/2020 1451   EOSABS 0.5 12/29/2020 1451   BASOSABS 0.1 12/29/2020 1451    CMP     Component Value Date/Time   NA 140 12/29/2020 1451   K 4.2 12/29/2020 1451   CL 103 12/29/2020 1451   CO2 31 12/29/2020 1451   GLUCOSE 81 12/29/2020 1451   BUN 14 12/29/2020 1451   CREATININE 0.81 12/29/2020 1451   CALCIUM 9.1  12/29/2020 1451   PROT 6.5 12/29/2020 1451   ALBUMIN 4.2 12/29/2020 1451   AST 14 12/29/2020 1451   ALT 18 12/29/2020 1451   ALKPHOS 72 12/29/2020 1451   BILITOT 0.5 12/29/2020 1451   GFRNONAA >90 07/30/2014 2016   GFRAA >90 07/30/2014 2016    ASSESSMENT/PLAN: 55 year old female with a history of GERD, history of esophageal ulceration versus mucosal tear in 2015, history of sessile serrated colon polyp who is seen in consultation at the request of Dr. Lorelei Pont to evaluate uncontrolled reflux symptoms.   1.  GERD --uncontrolled reflux symptoms despite once daily PPI.  I am suspicious that she also has eosinophilic esophagitis.  This would have explained the propensity for the esophageal tear that occurred 7 years ago.  I recommended that we repeat upper endoscopy. -- Continue pantoprazole 40 mg daily -- She can add famotidine 20 mg every afternoon/evening as needed for breakthrough heartburn -- Upper endoscopy in the [place] with esophageal biopsies  2.  History of SSP --sessile serrated polyp without dysplasia in 2018, repeat colonoscopy for surveillance in October 2023  3.  Obesity--recently started liraglutide.  This is likely causing some reduced gastric accommodation which may temporarily worsen reflux.  We are addressing that as in #1.  Over time weight reduction would be expected to improve #1.  She will continue this therapy for now  GD:JMEQAST, Gay F[place], [address] [place],   [id number]

## 2021-03-29 NOTE — Patient Instructions (Signed)
Continue pantoprazole 40 mg.  Please purchase the following medications over the counter and take as directed: Famotidine 20 mg every evening as needed.  You have been scheduled for an endoscopy. Please follow written instructions given to you at your visit today. If you use inhalers (even only as needed), please bring them with you on the day of your procedure.  If you are age 54 or younger, your body mass index should be between 19-25. Your Body mass index is 36.15 kg/m. If this is out of the aformentioned range listed, please consider follow up with your Primary Care Provider.   Due to recent changes in healthcare laws, you may see the results of your imaging and laboratory studies on MyChart before your provider has had a chance to review them.  We understand that in some cases there may be results that are confusing or concerning to you. Not all laboratory results come back in the same time frame and the provider may be waiting for multiple results in order to interpret others.  Please give [place] 48 hours in order for your provider to thoroughly review all the results before contacting the office for clarification of your results.

## 2021-03-30 ENCOUNTER — Encounter: Payer: Self-pay | Admitting: Internal Medicine

## 2021-03-30 ENCOUNTER — Other Ambulatory Visit: Payer: Self-pay

## 2021-03-30 ENCOUNTER — Ambulatory Visit (AMBULATORY_SURGERY_CENTER): Payer: 59 | Admitting: Internal Medicine

## 2021-03-30 VITALS — BP 126/76 | HR 88 | Temp 98.0°F | Resp 12 | Ht 66.0 in | Wt 224.0 lb

## 2021-03-30 DIAGNOSIS — K219 Gastro-esophageal reflux disease without esophagitis: Secondary | ICD-10-CM

## 2021-03-30 DIAGNOSIS — K2100 Gastro-esophageal reflux disease with esophagitis, without bleeding: Secondary | ICD-10-CM | POA: Diagnosis not present

## 2021-03-30 DIAGNOSIS — K449 Diaphragmatic hernia without obstruction or gangrene: Secondary | ICD-10-CM | POA: Diagnosis not present

## 2021-03-30 DIAGNOSIS — K2289 Other specified disease of esophagus: Secondary | ICD-10-CM | POA: Diagnosis not present

## 2021-03-30 DIAGNOSIS — G4710 Hypersomnia, unspecified: Secondary | ICD-10-CM | POA: Diagnosis not present

## 2021-03-30 MED ORDER — SODIUM CHLORIDE 0.9 % IV SOLN: 500.0000 mL | Freq: Once | INTRAVENOUS | Status: DC

## 2021-03-30 MED ORDER — PANTOPRAZOLE SODIUM 40 MG PO TBEC: 40.0000 mg | DELAYED_RELEASE_TABLET | Freq: Two times a day (BID) | ORAL | 3 refills | Status: DC

## 2021-03-30 NOTE — Patient Instructions (Signed)
YOU HAD AN ENDOSCOPIC PROCEDURE TODAY AT THE [facility]:   Refer to the procedure report that was given to you for any specific questions about what was found during the examination.  If the procedure report does not answer your questions, please call your gastroenterologist to clarify.  If you requested that your care partner not be given the details of your procedure findings, then the procedure report has been included in a sealed envelope for you to review at your convenience later.  YOU SHOULD EXPECT: Some feelings of bloating in the abdomen. Passage of more gas than usual.  Walking can help get rid of the air that was put into your GI tract during the procedure and reduce the bloating. If you had a lower endoscopy (such as a colonoscopy or flexible sigmoidoscopy) you may notice spotting of blood in your stool or on the toilet paper. If you underwent a bowel prep for your procedure, you may not have a normal bowel movement for a few days.  Please Note:  You might notice some irritation and congestion in your nose or some drainage.  This is from the oxygen used during your procedure.  There is no need for concern and it should clear up in a day or so.  SYMPTOMS TO REPORT IMMEDIATELY:    Following upper endoscopy (EGD)  Vomiting of blood or coffee ground material  New chest pain or pain under the shoulder blades  Painful or persistently difficult swallowing  New shortness of breath  Fever of 100F or higher  Black, tarry-looking stools  For urgent or emergent issues, a gastroenterologist can be reached at any hour by calling [phone]. Do not use MyChart messaging for urgent concerns.    DIET:  We do recommend a small meal at first, but then you may proceed to your regular diet.  Drink plenty of fluids but you should avoid alcoholic beverages for 24 hours.  ACTIVITY:  You should plan to take it easy for the rest of today and you should NOT DRIVE or use heavy machinery  until tomorrow (because of the sedation medicines used during the test).    FOLLOW UP: Our staff will call the number listed on your records 48-72 hours following your procedure to check on you and address any questions or concerns that you may have regarding the information given to you following your procedure. If we do not reach you, we will leave a message.  We will attempt to reach you two times.  During this call, we will ask if you have developed any symptoms of COVID 19. If you develop any symptoms (ie: fever, flu-like symptoms, shortness of breath, cough etc.) before then, please call [phone].  If you test positive for Covid 19 in the 2 weeks post procedure, please call and report this information to us.    If any biopsies were taken you will be contacted by phone or by letter within the next 1-3 weeks.  Please call us at [phone] if you have not heard about the biopsies in 3 weeks.    SIGNATURES/CONFIDENTIALITY: You and/or your care partner have signed paperwork which will be entered into your electronic medical record.  These signatures attest to the fact that that the information above on your After Visit Summary has been reviewed and is understood.  Full responsibility of the confidentiality of this discharge information lies with you and/or your care-partner. 

## 2021-03-30 NOTE — Progress Notes (Signed)
VS BY cw IN ADM

## 2021-03-30 NOTE — Op Note (Signed)
[place] Patient Name: Misty [name] Procedure Date: 03/30/2021 3:30 PM MRN: [id number] Endoscopist: Jerene Bears , MD Age: 55 Referring MD:  Date of Birth: 07-Oct-1966 Gender: Female Account #: [id number] Procedure:                Upper GI endoscopy Indications:              Gastro-esophageal reflux disease with persistent                            symptoms despite pantoprazole 40 mg once daily;                            history of esophageal tear versus ulceration in 2015 Medicines:                Monitored Anesthesia Care Procedure:                Pre-Anesthesia Assessment:                           - Prior to the procedure, a History and Physical                            was performed, and patient medications and                            allergies were reviewed. The patient's tolerance of                            previous anesthesia was also reviewed. The risks                            and benefits of the procedure and the sedation                            options and risks were discussed with the patient.                            All questions were answered, and informed consent                            was obtained. Prior Anticoagulants: The patient has                            taken no previous anticoagulant or antiplatelet                            agents. ASA Grade Assessment: II - A patient with                            mild systemic disease. After reviewing the risks                            and benefits, the patient was deemed in  satisfactory condition to undergo the procedure.                           After obtaining informed consent, the endoscope was                            passed under direct vision. Throughout the                            procedure, the patient's blood pressure, pulse, and                            oxygen saturations were monitored continuously. The                             Endoscope was introduced through the mouth, and                            advanced to the second part of duodenum. The upper                            GI endoscopy was accomplished without difficulty.                            The patient tolerated the procedure well. Scope In: Scope Out: Findings:                 LA Grade C (one or more mucosal breaks continuous                            between tops of 2 or more mucosal folds, less than                            75% circumference) esophagitis with no bleeding was                            found 33 to 35 cm from the incisors. Biopsies were                            taken with a cold forceps for histology (jar 1).                           The upper third of the esophagus and middle third                            of the esophagus were normal. Biopsies were taken                            with a cold forceps for histology (jar 2).                           A 4 cm hiatal hernia was present.  The gastroesophageal flap valve was visualized                            endoscopically and classified as Hill Grade IV (no                            fold, wide open lumen, hiatal hernia present).                           The entire examined stomach was normal.                           The examined duodenum was normal. Complications:            No immediate complications. Estimated Blood Loss:     Estimated blood loss was minimal. Impression:               - LA Grade C reflux esophagitis with no bleeding.                            Biopsied.                           - Normal upper third of esophagus and middle third                            of esophagus. Biopsied.                           - 4 cm hiatal hernia.                           - Normal stomach.                           - Normal examined duodenum. Recommendation:           - Patient has a contact number available for                             emergencies. The signs and symptoms of potential                            delayed complications were discussed with the                            patient. Return to normal activities tomorrow.                            Written discharge instructions were provided to the                            patient.                           - Resume previous diet.                           -  Continue present medications.                           - Increase pantoprazole 40 mg twice daily (before                            1st and last meal of the day). Anti-reflux diet.                            Famotidine 20 mg can still be used up to twice                            daily as needed for breakthrough heartburn/GERD                            symptoms.                           - Await pathology results.                           - Office follow-up in about 3 months. Jerene Bears, MD 03/30/2021 3:49:16 PM This report has been signed electronically.

## 2021-03-30 NOTE — Progress Notes (Signed)
Called to room to assist during endoscopic procedure.  Patient ID and intended procedure confirmed with present staff. Received instructions for my participation in the procedure from the performing physician.  

## 2021-03-30 NOTE — Progress Notes (Signed)
pt tolerated well. VSS. awake and to recovery. Report given to RN.  

## 2021-04-01 ENCOUNTER — Telehealth: Payer: Self-pay

## 2021-04-01 NOTE — Telephone Encounter (Signed)
LVM

## 2021-04-05 DIAGNOSIS — G4733 Obstructive sleep apnea (adult) (pediatric): Secondary | ICD-10-CM | POA: Diagnosis not present

## 2021-04-12 ENCOUNTER — Encounter: Payer: Self-pay | Admitting: Internal Medicine

## 2021-04-13 DIAGNOSIS — N950 Postmenopausal bleeding: Secondary | ICD-10-CM | POA: Diagnosis not present

## 2021-04-19 ENCOUNTER — Telehealth: Payer: Self-pay

## 2021-04-19 NOTE — Telephone Encounter (Signed)
Caller states that she thinks she has pink eye, redness, and discharge. Symptoms started up yesterday. Both eyes now, was just left eye in the beginning.

## 2021-04-19 NOTE — Telephone Encounter (Signed)
Patient states she spoke with her eye doctor and was given eye drops and she states she is much better. Will let us know if she needs anything else.

## 2021-04-20 ENCOUNTER — Other Ambulatory Visit: Payer: Self-pay | Admitting: Family Medicine

## 2021-04-20 DIAGNOSIS — I10 Essential (primary) hypertension: Secondary | ICD-10-CM

## 2021-04-21 DIAGNOSIS — G4733 Obstructive sleep apnea (adult) (pediatric): Secondary | ICD-10-CM | POA: Diagnosis not present

## 2021-05-14 ENCOUNTER — Encounter: Payer: Self-pay | Admitting: Family Medicine

## 2021-05-26 ENCOUNTER — Other Ambulatory Visit: Payer: Self-pay | Admitting: Family Medicine

## 2021-05-26 DIAGNOSIS — E7849 Other hyperlipidemia: Secondary | ICD-10-CM

## 2021-06-01 ENCOUNTER — Ambulatory Visit: Payer: 59 | Admitting: Family Medicine

## 2021-06-02 DIAGNOSIS — G4733 Obstructive sleep apnea (adult) (pediatric): Secondary | ICD-10-CM | POA: Diagnosis not present

## 2021-06-14 DIAGNOSIS — Z96651 Presence of right artificial knee joint: Secondary | ICD-10-CM | POA: Diagnosis not present

## 2021-06-14 DIAGNOSIS — M1712 Unilateral primary osteoarthritis, left knee: Secondary | ICD-10-CM | POA: Diagnosis not present

## 2021-06-19 ENCOUNTER — Other Ambulatory Visit: Payer: Self-pay | Admitting: Family Medicine

## 2021-06-19 DIAGNOSIS — R079 Chest pain, unspecified: Secondary | ICD-10-CM

## 2021-06-19 DIAGNOSIS — R1319 Other dysphagia: Secondary | ICD-10-CM

## 2021-06-19 DIAGNOSIS — R1310 Dysphagia, unspecified: Secondary | ICD-10-CM

## 2021-06-19 DIAGNOSIS — K920 Hematemesis: Secondary | ICD-10-CM

## 2021-07-10 ENCOUNTER — Other Ambulatory Visit: Payer: Self-pay | Admitting: Internal Medicine

## 2021-07-10 DIAGNOSIS — K219 Gastro-esophageal reflux disease without esophagitis: Secondary | ICD-10-CM

## 2021-07-25 NOTE — Progress Notes (Signed)
[facility] [address], [place], [place] [address] [phone]  Date:  07/27/2021   Name:  Misty [name]   DOB:  07/01/1966   MRN:  [id number]  PCP:  Darreld Mclean, MD    Chief Complaint: nodule on foot (Pt c/o a hard nodule on the top of the left foot. X 1.5 months, pain with palpation. /)   History of Present Illness:  Misty [name] is a 55 y.o. very pleasant female patient who presents with the following:  Pt seen today with a hard spot on her LEFT foot  Last seen by myself in April  History of hyperlipidemia, esophageal ulcer, asthma, OA/ polyarthralgia treated by rheumatology/ Dr Theda Sers at [facility]- surgery done 2/21  We had her try Kinder but it caused worsening of her GERD so she stopped using it.  She then was going to "[facility]" for various treatments-however, on further thought she decided not to go through with this.  She did recently get HRT from her gynecologist for hot flashes and plans to give it a try  She has noted a bump on the dorsum of her left foot which has been present for about 10- 12 weeks When it first came it it was tender- not tender any longer unless she presses directly on it No cause that she can think of -no injury, etc. 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   LACTOSE INTOLERANCE 03/24/2008   Asthma, f/u by her allergist  03/24/2008   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    Past Medical History:  Diagnosis Date   Acne    from IUD/hormonal replacement   Allergy    seasonal   Anxiety    Anxiety and depression    Asthma     Depression    Diverticulosis    Esophageal ulcer    by EGD 08-03-2014   GERD (gastroesophageal reflux disease)    Hiatal hernia    Hypertension    IUD (intrauterine device) in place    Lactose intolerance    Nonspecific abnormal results of thyroid function study    Osteoarthritis    Dr.Collins    Other and unspecified hyperlipidemia    Sessile colonic polyp    Thyroid disease     Past Surgical History:  Procedure Laterality Date   BILATERAL KNEE ARTHROSCOPY     Left: 11/10/2008, Right: 04/15/2009. Dr.Andy Collins   G 1 P 1     GANGLION CYST EXCISION     Right Wrist    LEEP  03/15/2018   right knee replacement 2021     SPIROMETRY  01/2016   TONSILLECTOMY AND ADENOIDECTOMY      Social History   Tobacco Use   Smoking status: Never   Smokeless tobacco: Never  Vaping Use   Vaping Use: Never used  Substance Use Topics   Alcohol use: Yes    Alcohol/week: 2.0 standard drinks    Types: 2 Glasses of wine per week    Comment: Socially   Drug use: No    Family History  Problem Relation Age  of Onset   COPD Mother        smoker   Heart disease Mother        stent @ 69; ablation for tachycardia   Heart attack Father 68   Lung disease Father        mesothelioma   Hypertension Father    Rheum arthritis Sister    Hyperlipidemia Sister    Hypertension Sister    Diabetes Maternal Aunt    Lung cancer Maternal Grandmother    Stroke Maternal Grandfather    Breast cancer Paternal Grandmother    Osteopenia Sister    Fibromyalgia Sister    Hyperlipidemia Sister    Celiac disease Sister    Diabetes Sister    Hypertension Sister    Heart attack Brother 80   Hypertension Brother    Hyperlipidemia Brother    Diabetes Sister    COPD Sister    Hypertension Sister    Eczema Daughter    Esophageal cancer Neg Hx    Stomach cancer Neg Hx    Colon cancer Neg Hx    Rectal cancer Neg Hx     Allergies  Allergen Reactions   Lactose Intolerance (Gi) Other (See Comments)    GI  side effects   Latex     Makes face itchy   Penicillins     Unspecified reaction as an infant    Medication list has been reviewed and updated.  Current Outpatient Medications on File Prior to Visit  Medication Sig Dispense Refill   albuterol (VENTOLIN HFA) 108 (90 Base) MCG/ACT inhaler Inhale into the lungs every 6 (six) hours as needed for wheezing or shortness of breath.     aspirin 81 MG tablet Take 81 mg by mouth daily. Reported on 02/23/2016     atorvastatin (LIPITOR) 10 MG tablet TAKE 1 TABLET BY MOUTH EVERY DAY 90 tablet 1   Azelaic Acid 15 % gel Apply 1 application topically 2 (two) times daily.     BIOTIN PO Take 1 tablet by mouth daily.      budesonide-formoterol (SYMBICORT) 80-4.5 MCG/ACT inhaler Inhale 2 puffs into the lungs 2 (two) times daily.     Calcium-Magnesium-Vitamin D (CALCIUM MAGNESIUM PO) Take 1 tablet by mouth daily.      Cholecalciferol (VITAMIN D3) 2000 units capsule Take 2,000 Units by mouth daily.     loratadine (CLARITIN) 10 MG tablet Take 10 mg by mouth as needed.      losartan (COZAAR) 50 MG tablet TAKE 1 TABLET BY MOUTH EVERY DAY 90 tablet 1   MAGNESIUM PO Take by mouth daily.     Misc Natural Products (ADRENAL PO) Take 1 tablet by mouth daily.      montelukast (SINGULAIR) 10 MG tablet Take 1 tablet (10 mg total) by mouth at bedtime. 90 tablet 3   NON FORMULARY retna cream for face     Omega-3 Fatty Acids (FISH OIL PO) Take 1 tablet by mouth daily.      OVER THE COUNTER MEDICATION Pro-biotic 1 capsule daily.     pantoprazole (PROTONIX) 40 MG tablet Take 1 tablet (40 mg total) by mouth 2 (two) times daily. TAKE ONE BEFORE FIRST MEAL AND BEFORE LAST MEAL OF THE DAY. 60 tablet 3   pantoprazole (PROTONIX) 40 MG tablet TAKE 1 TABLET BY MOUTH EVERY DAY 90 tablet 2   tretinoin (RETIN-A) 0.025 % cream Apply 1 application topically at bedtime.     venlafaxine XR (EFFEXOR-XR) 150 MG 24 hr capsule TAKE  1 CAPSULE (150 MG TOTAL) BY MOUTH DAILY WITH BREAKFAST. 90  capsule 3   Current Facility-Administered Medications on File Prior to Visit  Medication Dose Route Frequency Provider Last Rate Last Admin   0.9 %  sodium chloride infusion  500 mL Intravenous Once Pyrtle, Lajuan Lines, MD        Review of Systems:  As per HPI- otherwise negative.  Pulse Readings from Last 3 Encounters:  07/27/21 (!) 108  03/30/21 88  03/29/21 92    Physical Examination: Vitals:   07/27/21 1549  BP: 134/80  Pulse: (!) 108  Resp: 18  Temp: 98.5 F (36.9 C)  SpO2: 97%   Vitals:   07/27/21 1549  Weight: 231 lb 6.4 oz (105 kg)  Height: '5\' 5"'$  (1.651 m)   Body mass index is 38.51 kg/m. Ideal Body Weight: Weight in (lb) to have BMI = 25: 149.9  GEN: no acute distress.  Obese, otherwise looks well Noted mild tachycardia today.  Patient states she is not feeling short of breath, admits that she sometimes gets anxious coming to the doctor's office HEENT: Atraumatic, Normocephalic.  Ears and Nose: No external deformity. CV: RRR, No M/G/R. No JVD. No thrill. No extra heart sounds. PULM: CTA B, no wheezes, crackles, rhonchi. No retractions. No resp. distress. No accessory muscle use. EXTR: No c/c/e PSYCH: Normally interactive. Conversant.  There is a firm, teardrop shaped nodule under the skin of the left dorsal foot as pictured below.  It is approximately 1 cm x 1.5 cm in size.  It is mobile but not necessarily cystic.  It does not seem to be attached to the extensor tendons    Assessment and Plan: Mass of right foot - Plan: Ambulatory referral to Orthopedic Surgery  Need for influenza vaccination - Plan: Flu Vaccine QUAD 6+ mos PF IM (Fluarix Quad PF) Patient seen today with unusual nodule on the dorsum of her left foot.  It is large enough that it interferes with footwear and can be uncomfortable.  I referred her to orthopedics for further evaluation and possible surgical removal  Flu shot given today  This visit occurred during the SARS-CoV-2 public health  emergency.  Safety protocols were in place, including screening questions prior to the visit, additional usage of staff PPE, and extensive cleaning of exam room while observing appropriate contact time as indicated for disinfecting solutions.   Signed Lamar Blinks, MD

## 2021-07-27 ENCOUNTER — Ambulatory Visit: Payer: 59 | Admitting: Family Medicine

## 2021-07-27 ENCOUNTER — Other Ambulatory Visit: Payer: Self-pay

## 2021-07-27 VITALS — BP 134/80 | HR 108 | Temp 98.5°F | Resp 18 | Ht 65.0 in | Wt 231.4 lb

## 2021-07-27 DIAGNOSIS — R2241 Localized swelling, mass and lump, right lower limb: Secondary | ICD-10-CM

## 2021-07-27 DIAGNOSIS — J4530 Mild persistent asthma, uncomplicated: Secondary | ICD-10-CM | POA: Diagnosis not present

## 2021-07-27 DIAGNOSIS — J3081 Allergic rhinitis due to animal (cat) (dog) hair and dander: Secondary | ICD-10-CM | POA: Diagnosis not present

## 2021-07-27 DIAGNOSIS — J3089 Other allergic rhinitis: Secondary | ICD-10-CM | POA: Diagnosis not present

## 2021-07-27 DIAGNOSIS — Z23 Encounter for immunization: Secondary | ICD-10-CM

## 2021-07-27 DIAGNOSIS — J301 Allergic rhinitis due to pollen: Secondary | ICD-10-CM | POA: Diagnosis not present

## 2021-07-27 NOTE — Patient Instructions (Addendum)
We will get you in with orthopedics to look at your foot- let me know if you don't hear from them in the next week or so Address: [address], [place], [place] [address] Phone: [phone]  Flu shot given today

## 2021-08-01 DIAGNOSIS — Z96651 Presence of right artificial knee joint: Secondary | ICD-10-CM | POA: Diagnosis not present

## 2021-08-04 DIAGNOSIS — Z96651 Presence of right artificial knee joint: Secondary | ICD-10-CM | POA: Diagnosis not present

## 2021-08-09 DIAGNOSIS — M79672 Pain in left foot: Secondary | ICD-10-CM | POA: Diagnosis not present

## 2021-08-11 ENCOUNTER — Other Ambulatory Visit: Payer: Self-pay | Admitting: Internal Medicine

## 2021-08-11 DIAGNOSIS — K219 Gastro-esophageal reflux disease without esophagitis: Secondary | ICD-10-CM

## 2021-10-12 DIAGNOSIS — L718 Other rosacea: Secondary | ICD-10-CM | POA: Diagnosis not present

## 2021-10-12 DIAGNOSIS — L738 Other specified follicular disorders: Secondary | ICD-10-CM | POA: Diagnosis not present

## 2021-10-29 ENCOUNTER — Other Ambulatory Visit: Payer: Self-pay | Admitting: Family Medicine

## 2021-10-29 DIAGNOSIS — I10 Essential (primary) hypertension: Secondary | ICD-10-CM

## 2021-11-08 DIAGNOSIS — M1712 Unilateral primary osteoarthritis, left knee: Secondary | ICD-10-CM | POA: Diagnosis not present

## 2021-11-08 DIAGNOSIS — Z96651 Presence of right artificial knee joint: Secondary | ICD-10-CM | POA: Diagnosis not present

## 2021-12-15 DIAGNOSIS — N951 Menopausal and female climacteric states: Secondary | ICD-10-CM | POA: Diagnosis not present

## 2021-12-15 DIAGNOSIS — E063 Autoimmune thyroiditis: Secondary | ICD-10-CM | POA: Diagnosis not present

## 2021-12-15 DIAGNOSIS — R5383 Other fatigue: Secondary | ICD-10-CM | POA: Diagnosis not present

## 2021-12-15 DIAGNOSIS — R635 Abnormal weight gain: Secondary | ICD-10-CM | POA: Diagnosis not present

## 2021-12-20 DIAGNOSIS — E063 Autoimmune thyroiditis: Secondary | ICD-10-CM | POA: Diagnosis not present

## 2021-12-20 DIAGNOSIS — R635 Abnormal weight gain: Secondary | ICD-10-CM | POA: Diagnosis not present

## 2021-12-20 DIAGNOSIS — R5383 Other fatigue: Secondary | ICD-10-CM | POA: Diagnosis not present

## 2021-12-20 DIAGNOSIS — N951 Menopausal and female climacteric states: Secondary | ICD-10-CM | POA: Diagnosis not present

## 2022-01-05 NOTE — Patient Instructions (Incomplete)
It was great to see you again today, I will be in touch with your labs 

## 2022-01-05 NOTE — Progress Notes (Unsigned)
[place] [facility] [address] [place], [place] [address] [phone]  Date:  01/11/2022   Name:  Misty [name]   DOB:  1966-11-05   MRN:  [id number]  PCP:  Darreld Mclean, MD    Chief Complaint: No chief complaint on file.   History of Present Illness:  Misty [name] is a 56 y.o. very pleasant female patient who presents with the following:  Patient is seen today for physical exam Most recent visit with myself was in September-at that time she had a bump on her left foot, was referred to orthopedics and it looks like they did an excisional biopsy  History of hyperlipidemia, esophageal ulcer, asthma, OA/ polyarthralgia treated by rheumatology/ Dr Theda Sers at [facility]- surgery done 2/21  She fell up with her orthopedist in December for left knee pain.  She is status post right total knee replacement.  They gave her a Kenalog injection in the left knee  Mammogram COVID booster Mammogram and Pap per GYN? 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   LACTOSE INTOLERANCE 03/24/2008   Asthma, f/u by her allergist  03/24/2008   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    Past Medical History:  Diagnosis Date   Acne    from IUD/hormonal replacement   Allergy    seasonal   Anxiety    Anxiety and depression    Asthma    Depression    Diverticulosis    Esophageal ulcer    by EGD 08-03-2014   GERD (gastroesophageal reflux disease)    Hiatal hernia    Hypertension    IUD  (intrauterine device) in place    Lactose intolerance    Nonspecific abnormal results of thyroid function study    Osteoarthritis    Dr.Collins    Other and unspecified hyperlipidemia    Sessile colonic polyp    Thyroid disease     Past Surgical History:  Procedure Laterality Date   BILATERAL KNEE ARTHROSCOPY     Left: 11/10/2008, Right: 04/15/2009. Dr.Andy Collins   G 1 P 1     GANGLION CYST EXCISION     Right Wrist    LEEP  03/15/2018   right knee replacement 2021     SPIROMETRY  01/2016   TONSILLECTOMY AND ADENOIDECTOMY      Social History   Tobacco Use   Smoking status: Never   Smokeless tobacco: Never  Vaping Use   Vaping Use: Never used  Substance Use Topics   Alcohol use: Yes    Alcohol/week: 2.0 standard drinks    Types: 2 Glasses of wine per week    Comment: Socially   Drug use: No    Family History  Problem Relation Age of Onset   COPD Mother        smoker   Heart disease Mother        stent @ 56; ablation for tachycardia   Heart attack Father 14   Lung disease Father        mesothelioma   Hypertension Father  Rheum arthritis Sister    Hyperlipidemia Sister    Hypertension Sister    Diabetes Maternal Aunt    Lung cancer Maternal Grandmother    Stroke Maternal Grandfather    Breast cancer Paternal Grandmother    Osteopenia Sister    Fibromyalgia Sister    Hyperlipidemia Sister    Celiac disease Sister    Diabetes Sister    Hypertension Sister    Heart attack Brother 40   Hypertension Brother    Hyperlipidemia Brother    Diabetes Sister    COPD Sister    Hypertension Sister    Eczema Daughter    Esophageal cancer Neg Hx    Stomach cancer Neg Hx    Colon cancer Neg Hx    Rectal cancer Neg Hx     Allergies  Allergen Reactions   Lactose Intolerance (Gi) Other (See Comments)    GI side effects   Latex     Makes face itchy   Penicillins     Unspecified reaction as an infant    Medication list has been reviewed and  updated.  Current Outpatient Medications on File Prior to Visit  Medication Sig Dispense Refill   albuterol (VENTOLIN HFA) 108 (90 Base) MCG/ACT inhaler Inhale into the lungs every 6 (six) hours as needed for wheezing or shortness of breath.     aspirin 81 MG tablet Take 81 mg by mouth daily. Reported on 02/23/2016     atorvastatin (LIPITOR) 10 MG tablet TAKE 1 TABLET BY MOUTH EVERY DAY 90 tablet 1   Azelaic Acid 15 % gel Apply 1 application topically 2 (two) times daily.     BIOTIN PO Take 1 tablet by mouth daily.      budesonide-formoterol (SYMBICORT) 80-4.5 MCG/ACT inhaler Inhale 2 puffs into the lungs 2 (two) times daily.     Calcium-Magnesium-Vitamin D (CALCIUM MAGNESIUM PO) Take 1 tablet by mouth daily.      Cholecalciferol (VITAMIN D3) 2000 units capsule Take 2,000 Units by mouth daily.     loratadine (CLARITIN) 10 MG tablet Take 10 mg by mouth as needed.      losartan (COZAAR) 50 MG tablet TAKE 1 TABLET BY MOUTH EVERY DAY 90 tablet 1   MAGNESIUM PO Take by mouth daily.     Misc Natural Products (ADRENAL PO) Take 1 tablet by mouth daily.      montelukast (SINGULAIR) 10 MG tablet Take 1 tablet (10 mg total) by mouth at bedtime. 90 tablet 3   NON FORMULARY retna cream for face     Omega-3 Fatty Acids (FISH OIL PO) Take 1 tablet by mouth daily.      OVER THE COUNTER MEDICATION Pro-biotic 1 capsule daily.     pantoprazole (PROTONIX) 40 MG tablet Take 1 tablet (40 mg total) by mouth 2 (two) times daily. TAKE ONE BEFORE FIRST MEAL AND BEFORE LAST MEAL OF THE DAY. 60 tablet 3   pantoprazole (PROTONIX) 40 MG tablet TAKE 1 TABLET BY MOUTH EVERY DAY 90 tablet 2   tretinoin (RETIN-A) 0.025 % cream Apply 1 application topically at bedtime.     venlafaxine XR (EFFEXOR-XR) 150 MG 24 hr capsule TAKE 1 CAPSULE (150 MG TOTAL) BY MOUTH DAILY WITH BREAKFAST. 90 capsule 3   Current Facility-Administered Medications on File Prior to Visit  Medication Dose Route Frequency Provider Last Rate Last Admin    0.9 %  sodium chloride infusion  500 mL Intravenous Once Pyrtle, Lajuan Lines, MD  Review of Systems:  As per HPI- otherwise negative.   Physical Examination: There were no vitals filed for this visit. There were no vitals filed for this visit. There is no height or weight on file to calculate BMI. Ideal Body Weight:    GEN: no acute distress. HEENT: Atraumatic, Normocephalic.  Ears and Nose: No external deformity. CV: RRR, No M/G/R. No JVD. No thrill. No extra heart sounds. PULM: CTA B, no wheezes, crackles, rhonchi. No retractions. No resp. distress. No accessory muscle use. ABD: S, NT, ND, +BS. No rebound. No HSM. EXTR: No c/c/e PSYCH: Normally interactive. Conversant.    Assessment and Plan: *** Patient seen today for physical exam.  Encouraged healthy diet and exercise routine Will plan further follow- up pending labs.  Signed Lamar Blinks, MD

## 2022-01-11 ENCOUNTER — Encounter: Payer: 59 | Admitting: Family Medicine

## 2022-01-11 DIAGNOSIS — I10 Essential (primary) hypertension: Secondary | ICD-10-CM

## 2022-01-11 DIAGNOSIS — R5383 Other fatigue: Secondary | ICD-10-CM

## 2022-01-11 DIAGNOSIS — Z0000 Encounter for general adult medical examination without abnormal findings: Secondary | ICD-10-CM

## 2022-01-11 DIAGNOSIS — E785 Hyperlipidemia, unspecified: Secondary | ICD-10-CM

## 2022-01-11 DIAGNOSIS — Z130 Encounter for screening for diseases of the blood and blood-forming organs and certain disorders involving the immune mechanism: Secondary | ICD-10-CM

## 2022-01-11 DIAGNOSIS — Z131 Encounter for screening for diabetes mellitus: Secondary | ICD-10-CM

## 2022-01-17 DIAGNOSIS — R635 Abnormal weight gain: Secondary | ICD-10-CM | POA: Diagnosis not present

## 2022-01-17 DIAGNOSIS — R5383 Other fatigue: Secondary | ICD-10-CM | POA: Diagnosis not present

## 2022-01-17 DIAGNOSIS — E063 Autoimmune thyroiditis: Secondary | ICD-10-CM | POA: Diagnosis not present

## 2022-01-17 DIAGNOSIS — N951 Menopausal and female climacteric states: Secondary | ICD-10-CM | POA: Diagnosis not present
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
## 2022-01-21 NOTE — Patient Instructions (Signed)
It was good to see you again today, I will be in touch with your labs  Please set up a visit with your GI doc- Dr Smith - for your screening colonoscopy  [phone]  Please check with your insurance about coverage of GLP-1 agonist drugs for weight loss.  These drugs would include Saxenda, Wegovy, Zepbound  Please clarify with your insurance if they cover this for weight loss as opposed to diabetes.  If one of these medications is covered I am more than happy to prescribe you 

## 2022-01-25 ENCOUNTER — Ambulatory Visit (INDEPENDENT_AMBULATORY_CARE_PROVIDER_SITE_OTHER): Payer: 59 | Admitting: Family Medicine

## 2022-01-25 ENCOUNTER — Encounter: Payer: Self-pay | Admitting: Family Medicine

## 2022-01-25 VITALS — BP 124/80 | HR 83 | Temp 97.9°F | Resp 18 | Wt 235.0 lb

## 2022-01-25 DIAGNOSIS — E785 Hyperlipidemia, unspecified: Secondary | ICD-10-CM | POA: Diagnosis not present

## 2022-01-25 DIAGNOSIS — Z0000 Encounter for general adult medical examination without abnormal findings: Secondary | ICD-10-CM

## 2022-01-25 DIAGNOSIS — Z131 Encounter for screening for diabetes mellitus: Secondary | ICD-10-CM

## 2022-01-25 DIAGNOSIS — I10 Essential (primary) hypertension: Secondary | ICD-10-CM | POA: Diagnosis not present

## 2022-01-25 DIAGNOSIS — Z9109 Other allergy status, other than to drugs and biological substances: Secondary | ICD-10-CM

## 2022-01-25 DIAGNOSIS — Z1329 Encounter for screening for other suspected endocrine disorder: Secondary | ICD-10-CM

## 2022-01-25 DIAGNOSIS — E7849 Other hyperlipidemia: Secondary | ICD-10-CM | POA: Diagnosis not present

## 2022-01-25 DIAGNOSIS — F325 Major depressive disorder, single episode, in full remission: Secondary | ICD-10-CM

## 2022-01-25 DIAGNOSIS — R69 Illness, unspecified: Secondary | ICD-10-CM | POA: Diagnosis not present

## 2022-01-25 LAB — COMPREHENSIVE METABOLIC PANEL
ALT: 23 U/L (ref 0–35)
AST: 15 U/L (ref 0–37)
Albumin: 4.2 g/dL (ref 3.5–5.2)
Alkaline Phosphatase: 96 U/L (ref 39–117)
BUN: 9 mg/dL (ref 6–23)
CO2: 29 mEq/L (ref 19–32)
Calcium: 9.1 mg/dL (ref 8.4–10.5)
Chloride: 105 mEq/L (ref 96–112)
Creatinine, Ser: 0.64 mg/dL (ref 0.40–1.20)
GFR: 99.87 mL/min (ref >60.00)
Glucose, Bld: 123 mg/dL — ABNORMAL HIGH (ref 70–99)
Potassium: 4.4 mEq/L (ref 3.5–5.1)
Sodium: 141 mEq/L (ref 135–145)
Total Bilirubin: 0.4 mg/dL (ref 0.2–1.2)
Total Protein: 6.3 g/dL (ref 6.0–8.3)

## 2022-01-25 LAB — HEMOGLOBIN A1C: Hgb A1c MFr Bld: 5.7 % (ref 4.6–6.5)

## 2022-01-25 LAB — CBC
HCT: 36.5 % (ref 36.0–46.0)
Hemoglobin: 12.3 g/dL (ref 12.0–15.0)
MCHC: 33.8 g/dL (ref 30.0–36.0)
MCV: 93.4 fl (ref 78.0–100.0)
Platelets: 266 10*3/uL (ref 150.0–400.0)
RBC: 3.91 Mil/uL (ref 3.87–5.11)
RDW: 13.6 % (ref 11.5–15.5)
WBC: 6.3 10*3/uL (ref 4.0–10.5)

## 2022-01-25 LAB — LIPID PANEL
Cholesterol: 176 mg/dL (ref 0–200)
HDL: 66.8 mg/dL (ref >39.00)
LDL Cholesterol: 94 mg/dL (ref 0–99)
NonHDL: 109.49
Total CHOL/HDL Ratio: 3
Triglycerides: 76 mg/dL (ref 0.0–149.0)
VLDL: 15.2 mg/dL (ref 0.0–40.0)

## 2022-01-25 MED ORDER — MONTELUKAST SODIUM 10 MG PO TABS: 10.0000 mg | ORAL_TABLET | Freq: Every day | ORAL | 3 refills | Status: DC

## 2022-01-25 MED ORDER — VENLAFAXINE HCL ER 150 MG PO CP24: 150.0000 mg | ORAL_CAPSULE | Freq: Every day | ORAL | 3 refills | Status: DC

## 2022-01-25 MED ORDER — ATORVASTATIN CALCIUM 10 MG PO TABS: 10.0000 mg | ORAL_TABLET | Freq: Every day | ORAL | 3 refills | Status: DC

## 2022-01-25 MED ORDER — LOSARTAN POTASSIUM 50 MG PO TABS: 50.0000 mg | ORAL_TABLET | Freq: Every day | ORAL | 3 refills | Status: DC

## 2022-03-08 DIAGNOSIS — Z01419 Encounter for gynecological examination (general) (routine) without abnormal findings: Secondary | ICD-10-CM | POA: Diagnosis not present

## 2022-03-08 DIAGNOSIS — Z6839 Body mass index (BMI) 39.0-39.9, adult: Secondary | ICD-10-CM | POA: Diagnosis not present

## 2022-03-08 DIAGNOSIS — Z124 Encounter for screening for malignant neoplasm of cervix: Secondary | ICD-10-CM | POA: Diagnosis not present

## 2022-03-08 DIAGNOSIS — Z1231 Encounter for screening mammogram for malignant neoplasm of breast: Secondary | ICD-10-CM | POA: Diagnosis not present

## 2022-03-08 LAB — HM MAMMOGRAPHY

## 2022-03-09 ENCOUNTER — Encounter: Payer: Self-pay | Admitting: Family Medicine

## 2022-03-09 DIAGNOSIS — D2271 Melanocytic nevi of right lower limb, including hip: Secondary | ICD-10-CM | POA: Diagnosis not present

## 2022-03-09 DIAGNOSIS — L438 Other lichen planus: Secondary | ICD-10-CM | POA: Diagnosis not present

## 2022-03-09 DIAGNOSIS — D2261 Melanocytic nevi of right upper limb, including shoulder: Secondary | ICD-10-CM | POA: Diagnosis not present

## 2022-03-09 DIAGNOSIS — L814 Other melanin hyperpigmentation: Secondary | ICD-10-CM | POA: Diagnosis not present

## 2022-03-09 DIAGNOSIS — D225 Melanocytic nevi of trunk: Secondary | ICD-10-CM | POA: Diagnosis not present

## 2022-03-09 DIAGNOSIS — L718 Other rosacea: Secondary | ICD-10-CM | POA: Diagnosis not present

## 2022-03-09 DIAGNOSIS — L570 Actinic keratosis: Secondary | ICD-10-CM | POA: Diagnosis not present

## 2022-03-09 DIAGNOSIS — D2262 Melanocytic nevi of left upper limb, including shoulder: Secondary | ICD-10-CM | POA: Diagnosis not present

## 2022-03-09 DIAGNOSIS — E669 Obesity, unspecified: Secondary | ICD-10-CM

## 2022-03-09 LAB — HM PAP SMEAR

## 2022-03-10 MED ORDER — WEGOVY 0.25 MG/0.5ML ~~LOC~~ SOAJ: 0.2500 mg | SUBCUTANEOUS | 2 refills | Status: DC

## 2022-03-28 DIAGNOSIS — M1712 Unilateral primary osteoarthritis, left knee: Secondary | ICD-10-CM | POA: Diagnosis not present

## 2022-04-30 ENCOUNTER — Other Ambulatory Visit: Payer: Self-pay | Admitting: Family Medicine

## 2022-04-30 DIAGNOSIS — K219 Gastro-esophageal reflux disease without esophagitis: Secondary | ICD-10-CM

## 2022-06-19 ENCOUNTER — Other Ambulatory Visit: Payer: Self-pay | Admitting: Family Medicine

## 2022-06-19 DIAGNOSIS — F325 Major depressive disorder, single episode, in full remission: Secondary | ICD-10-CM

## 2022-06-21 DIAGNOSIS — N951 Menopausal and female climacteric states: Secondary | ICD-10-CM | POA: Diagnosis not present

## 2022-06-21 DIAGNOSIS — R5383 Other fatigue: Secondary | ICD-10-CM | POA: Diagnosis not present

## 2022-06-21 DIAGNOSIS — R635 Abnormal weight gain: Secondary | ICD-10-CM | POA: Diagnosis not present

## 2022-06-29 DIAGNOSIS — N951 Menopausal and female climacteric states: Secondary | ICD-10-CM | POA: Diagnosis not present

## 2022-06-29 DIAGNOSIS — R635 Abnormal weight gain: Secondary | ICD-10-CM | POA: Diagnosis not present

## 2022-06-29 DIAGNOSIS — E063 Autoimmune thyroiditis: Secondary | ICD-10-CM | POA: Diagnosis not present

## 2022-06-29 DIAGNOSIS — R5383 Other fatigue: Secondary | ICD-10-CM | POA: Diagnosis not present

## 2022-07-07 DIAGNOSIS — J3089 Other allergic rhinitis: Secondary | ICD-10-CM | POA: Diagnosis not present

## 2022-07-07 DIAGNOSIS — J3081 Allergic rhinitis due to animal (cat) (dog) hair and dander: Secondary | ICD-10-CM | POA: Diagnosis not present

## 2022-07-07 DIAGNOSIS — J301 Allergic rhinitis due to pollen: Secondary | ICD-10-CM | POA: Diagnosis not present

## 2022-07-07 DIAGNOSIS — J4530 Mild persistent asthma, uncomplicated: Secondary | ICD-10-CM | POA: Diagnosis not present

## 2022-07-24 ENCOUNTER — Other Ambulatory Visit: Payer: Self-pay | Admitting: Family Medicine

## 2022-07-24 DIAGNOSIS — K219 Gastro-esophageal reflux disease without esophagitis: Secondary | ICD-10-CM

## 2022-08-04 DIAGNOSIS — R5383 Other fatigue: Secondary | ICD-10-CM | POA: Diagnosis not present

## 2022-08-04 DIAGNOSIS — N951 Menopausal and female climacteric states: Secondary | ICD-10-CM | POA: Diagnosis not present

## 2022-08-29 DIAGNOSIS — G4733 Obstructive sleep apnea (adult) (pediatric): Secondary | ICD-10-CM | POA: Diagnosis not present

## 2022-08-31 ENCOUNTER — Telehealth: Payer: Self-pay | Admitting: Family Medicine

## 2022-08-31 NOTE — Telephone Encounter (Signed)
Pt called stating she was experiencing the following symptoms:  -BP Readings: 143/83 on 10.10.23 AND 140/86 10.11.23  -Dizziness  -Felt like passing out/blacking out

## 2022-08-31 NOTE — Telephone Encounter (Signed)
Nurse Assessment Nurse: Sheppard Plumber, RN, Estill Bamberg Date/Time (Eastern Time): 08/31/2022 12:53:33 PM Confirm and document reason for call. If symptomatic, describe symptoms. ---caller states she has felt dizzy a few times. asking for appointment. 5th it was 132/90 yesterday 140/86 and is on bp meds Does the patient have any new or worsening symptoms? ---Yes Will a triage be completed? ---Yes Related visit to physician within the last 2 weeks? ---Yes Does the PT have any chronic conditions? (i.e. diabetes, asthma, this includes High risk factors for pregnancy, etc.) ---Yes List chronic conditions. ---HTN, asthma Is this a behavioral health or substance abuse call? ---No Guidelines Guideline Title Affirmed Question Affirmed Notes Nurse Date/Time (Eastern Time) Blood Pressure - High [9] Systolic BP >= 024 OR Diastolic >= 80 AND [0] taking BP medications Humfleet, RN, Estill Bamberg 08/31/2022 12:55:17 PM Disp. Time Eilene Ghazi Time) Disposition Final User 08/31/2022 12:59:23 PM See PCP within 2 Weeks Yes Humfleet, RN, Estill Bamberg PLEASE NOTE: All timestamps contained within this report are represented as [place] Standard Time. CONFIDENTIALTY NOTICE: This fax transmission is intended only for the addressee. It contains information that is legally privileged, confidential or otherwise protected from use or disclosure. If you are not the intended recipient, you are strictly prohibited from reviewing, disclosing, copying using or disseminating any of this information or taking any action in reliance on or regarding this information. If you have received this fax in error, please notify us immediately by telephone so that we can arrange for its return to [place]. Phone: [phone], Toll-Free: [phone], Fax: [phone] Page: 2 of 2 Call Id: [id number] Final Disposition 08/31/2022 12:59:23 PM See PCP within 2 Weeks Yes Humfleet, RN, Shelly Coss Disagree/Comply Comply Caller Understands  Yes PreDisposition Did not know what to do Care Advice Given Per Guideline * PCP VISIT: Call your doctor (or NP/PA) during regular office hours and make an appointment. * You need to be seen for this ongoing problem within the next 2 weeks. SEE PCP WITHIN 2 WEEKS: CARE ADVICE given per High Blood Pressure (Adult) guideline. * You become worse CALL BACK IF: * Chest pain or difficulty breathing occurs * Your blood pressure is over 160/100 Comments User: Rozelle Logan, RN Date/Time Eilene Ghazi Time): 08/31/2022 12:57:02 PM says 2 weeks ago had "flutters" lasted for 5 mins and felt continual and denies shortness of breath with it but a little lightheaded User: Rozelle Logan, RN Date/Time Eilene Ghazi Time): 08/31/2022 1:00:02 PM has not had the HR symptom in the last 2 weeks. will call back if it happens again Referrals REFERRED TO PCP OFFICE

## 2022-08-31 NOTE — Telephone Encounter (Signed)
Appt scheduled 09/11/22

## 2022-09-06 DIAGNOSIS — M1712 Unilateral primary osteoarthritis, left knee: Secondary | ICD-10-CM | POA: Diagnosis not present

## 2022-09-07 ENCOUNTER — Ambulatory Visit: Payer: 59 | Admitting: Family

## 2022-09-07 ENCOUNTER — Encounter: Payer: Self-pay | Admitting: Family

## 2022-09-07 DIAGNOSIS — I10 Essential (primary) hypertension: Secondary | ICD-10-CM

## 2022-09-07 MED ORDER — LOSARTAN POTASSIUM 50 MG PO TABS: 50.0000 mg | ORAL_TABLET | Freq: Two times a day (BID) | ORAL | 0 refills | Status: DC

## 2022-09-07 NOTE — Progress Notes (Signed)
Misty [name] is a 56 y.o. 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     aspirin 81 MG tablet Take 81 mg by mouth daily. Reported on 02/23/2016     atorvastatin (LIPITOR) 10 MG tablet Take 1 tablet (10 mg total) by mouth daily. 90 tablet 3   budesonide-formoterol (SYMBICORT) 80-4.5 MCG/ACT inhaler Inhale 2 puffs into the lungs 2 (two) times daily.     Cholecalciferol (VITAMIN D3) 2000 units capsule Take 2,000 Units by mouth daily.     ESTRADIOL PO Take by mouth.     loratadine (CLARITIN) 10 MG tablet Take 10 mg by mouth as needed.      MAGNESIUM PO Take by mouth daily.     Misc Natural Products (ADRENAL PO) Take 1 tablet by mouth daily.      montelukast (SINGULAIR) 10 MG tablet Take 1 tablet (10 mg total) by mouth at bedtime. 90 tablet 3   NON FORMULARY retna cream for face     Omega-3 Fatty Acids (FISH OIL PO) Take 1 tablet by mouth daily.      OVER THE COUNTER MEDICATION Pro-biotic 1 capsule daily.     pantoprazole (PROTONIX) 40 MG tablet Take 1 tablet (40 mg total) by mouth daily. 90 tablet 0   progesterone (PROMETRIUM) 200 MG capsule Take 200 mg by mouth at bedtime.     tretinoin (RETIN-A) 0.025 % cream Apply 1 application topically at bedtime.     venlafaxine XR (EFFEXOR-XR) 150 MG 24 hr capsule TAKE 1 CAPSULE BY MOUTH DAILY WITH BREAKFAST. 90 capsule 3    Azelaic Acid 15 % gel Apply 1 application topically 2 (two) times daily.     losartan (COZAAR) 50 MG tablet Take 1 tablet (50 mg total) by mouth 2 (two) times daily. 180 tablet 0   No current facility-administered medications for this visit.    Allergies: Lactose intolerance (gi), Latex, and Penicillins  Past Medical History:  Diagnosis Date   Acne    from IUD/hormonal replacement   Allergy    seasonal   Anxiety    Anxiety and depression    Asthma    Depression    Diverticulosis    Esophageal ulcer    by EGD 08-03-2014   GERD (gastroesophageal reflux disease)    Hiatal hernia    Hypertension    IUD (intrauterine device) in place    Lactose intolerance    Nonspecific abnormal results of thyroid function study    Osteoarthritis    Dr.Collins    Other and unspecified hyperlipidemia    Sessile colonic polyp    Thyroid disease     Past Surgical History:  Procedure Laterality Date   BILATERAL KNEE ARTHROSCOPY     Left: 11/10/2008, Right: 04/15/2009. Dr.Andy Collins   G 1 P 1     GANGLION CYST EXCISION     Right Wrist  LEEP  03/15/2018   right knee replacement 2021     SPIROMETRY  01/2016   TONSILLECTOMY AND ADENOIDECTOMY      Family History  Problem Relation Age of Onset   COPD Mother        smoker   Heart disease Mother        stent @ 53; ablation for tachycardia   Heart attack Father 54   Lung disease Father        mesothelioma   Hypertension Father    Rheum arthritis Sister    Hyperlipidemia Sister    Hypertension Sister    Diabetes Maternal Aunt    Lung cancer Maternal Grandmother    Stroke Maternal Grandfather    Breast cancer Paternal Grandmother    Osteopenia Sister    Fibromyalgia Sister    Hyperlipidemia Sister    Celiac disease Sister    Diabetes Sister    Hypertension Sister    Heart attack Brother 42   Hypertension Brother    Hyperlipidemia Brother    Diabetes Sister    COPD Sister    Hypertension Sister    Eczema Daughter    Esophageal  cancer Neg Hx    Stomach cancer Neg Hx    Colon cancer Neg Hx    Rectal cancer Neg Hx     Social History   Tobacco Use   Smoking status: Never   Smokeless tobacco: Never  Substance Use Topics   Alcohol use: Yes    Alcohol/week: 2.0 standard drinks of alcohol    Types: 2 Glasses of wine per week    Comment: Socially    Subjective:    Has been working with [employer] for the past 6 weeks; notes that in the past 4 weeks has been noticing that blood pressure has been running "high." Is taking supplements per weight loss clinic; not sure if her blood pressure has been up some for a while or if she is just now aware of it because she is monitoring more regularly;  Does work with provider managing hormones- had started testosterone supplement and was concerned this could be raising her blood pressure; actually stopped the testosterone earlier this week;  Labs done with integrative provider in the past month- per patient, essentially normal except the testosterone;     Objective:  Vitals:   09/07/22 1545  BP: (!) 140/80  Pulse: 91  Temp: 98.3 F (36.8 C)  TempSrc: Oral  SpO2: 97%  Weight: 227 lb 9.6 oz (103.2 kg)  Height: '5\' 6"'$  (1.676 m)    General: Well developed, well nourished, in no acute distress  Skin : Warm and dry.  Head: Normocephalic and atraumatic  Lungs: Respirations unlabored; clear to auscultation bilaterally without wheeze, rales, rhonchi  CVS exam: normal rate and regular rhythm.  Neurologic: Alert and oriented; speech intact; face symmetrical; moves all extremities well; CNII-XII intact without focal deficit   Assessment:  1. Essential hypertension     Plan:  Suspect supplements she has been taking for weight loss program is causing blood pressure elevation; she will reach out to her weight loss provider and integrative health provider to get clarification on her supplements; in the interim, okay to increase Losartan to 50 mg bid; follow up with  her PCP if blood pressure does not normalize in next 2 weeks;   No follow-ups on file.  No orders of the defined types were placed in this encounter.   Requested Prescriptions   Signed Prescriptions Disp  Refills   losartan (COZAAR) 50 MG tablet 180 tablet 0    Sig: Take 1 tablet (50 mg total) by mouth 2 (two) times daily.

## 2022-09-07 NOTE — Patient Instructions (Signed)
Please reach out to your Integrative Provider and your weight loss provider to discuss the supplements/ side effects of the medications you are taking;

## 2022-09-11 ENCOUNTER — Ambulatory Visit: Payer: 59 | Admitting: Family Medicine

## 2022-10-19 ENCOUNTER — Other Ambulatory Visit: Payer: Self-pay | Admitting: Family Medicine

## 2022-10-19 DIAGNOSIS — K219 Gastro-esophageal reflux disease without esophagitis: Secondary | ICD-10-CM

## 2022-11-02 DIAGNOSIS — N950 Postmenopausal bleeding: Secondary | ICD-10-CM | POA: Diagnosis not present

## 2022-12-07 ENCOUNTER — Other Ambulatory Visit: Payer: Self-pay | Admitting: Family

## 2022-12-07 DIAGNOSIS — I10 Essential (primary) hypertension: Secondary | ICD-10-CM

## 2022-12-16 ENCOUNTER — Other Ambulatory Visit: Payer: Self-pay | Admitting: Family Medicine

## 2022-12-16 DIAGNOSIS — E7849 Other hyperlipidemia: Secondary | ICD-10-CM

## 2022-12-16 DIAGNOSIS — Z9109 Other allergy status, other than to drugs and biological substances: Secondary | ICD-10-CM

## 2022-12-28 DIAGNOSIS — N950 Postmenopausal bleeding: Secondary | ICD-10-CM | POA: Diagnosis not present

## 2022-12-28 DIAGNOSIS — N858 Other specified noninflammatory disorders of uterus: Secondary | ICD-10-CM | POA: Diagnosis not present

## 2023-01-11 DIAGNOSIS — N760 Acute vaginitis: Secondary | ICD-10-CM | POA: Diagnosis not present

## 2023-01-11 DIAGNOSIS — Z09 Encounter for follow-up examination after completed treatment for conditions other than malignant neoplasm: Secondary | ICD-10-CM | POA: Diagnosis not present

## 2023-01-11 DIAGNOSIS — N950 Postmenopausal bleeding: Secondary | ICD-10-CM | POA: Diagnosis not present

## 2023-02-04 NOTE — Progress Notes (Deleted)
Misty [name] at [facility] [address], [place], [place] [address] [phone]  Date:  02/08/2023   Name:  Misty [name]   DOB:  1966/07/15   MRN:  [id number]  PCP:  Darreld Mclean, MD    Chief Complaint: No chief complaint on file.   History of Present Illness:  Misty [name] is a 57 y.o. very pleasant female patient who presents with the following:  Pt seen today for a CPE Last seen by myself about a year ago History of hyperlipidemia, minimal prediabetes, esophageal ulcer, asthma, OA/ polyarthralgia treated by rheumatology/ Dr Theda Sers at [facility]   I started her on wegovy last year- ? 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   LACTOSE INTOLERANCE 03/24/2008   Asthma, f/u by her allergist  03/24/2008   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    Past Medical History:  Diagnosis Date   Acne    from IUD/hormonal replacement   Allergy    seasonal   Anxiety    Anxiety and depression    Asthma    Depression    Diverticulosis    Esophageal ulcer    by EGD 08-03-2014   GERD (gastroesophageal reflux disease)    Hiatal hernia    Hypertension    IUD (intrauterine device) in place    Lactose intolerance    Nonspecific abnormal results of thyroid function study    Osteoarthritis    Dr.Collins    Other and unspecified hyperlipidemia    Sessile colonic polyp    Thyroid disease     Past Surgical History:  Procedure Laterality Date   BILATERAL KNEE ARTHROSCOPY     Left: 11/10/2008, Right: 04/15/2009. Dr.Andy Collins   G 1 P 1      GANGLION CYST EXCISION     Right Wrist    LEEP  03/15/2018   right knee replacement 2021     SPIROMETRY  01/2016   TONSILLECTOMY AND ADENOIDECTOMY      Social History   Tobacco Use   Smoking status: Never   Smokeless tobacco: Never  Vaping Use   Vaping Use: Never used  Substance Use Topics   Alcohol use: Yes    Alcohol/week: 2.0 standard drinks of alcohol    Types: 2 Glasses of wine per week    Comment: Socially   Drug use: No    Family History  Problem Relation Age of Onset   COPD Mother        smoker   Heart disease Mother        stent @ 71; ablation for tachycardia   Heart attack Father 37   Lung disease Father        mesothelioma   Hypertension Father    Rheum arthritis Sister    Hyperlipidemia Sister    Hypertension Sister    Diabetes Maternal Aunt    Lung cancer Maternal Grandmother    Stroke Maternal Grandfather    Breast cancer Paternal Grandmother    Osteopenia Sister    Fibromyalgia Sister    Hyperlipidemia Sister  Celiac disease Sister    Diabetes Sister    Hypertension Sister    Heart attack Brother 75   Hypertension Brother    Hyperlipidemia Brother    Diabetes Sister    COPD Sister    Hypertension Sister    Eczema Daughter    Esophageal cancer Neg Hx    Stomach cancer Neg Hx    Colon cancer Neg Hx    Rectal cancer Neg Hx     Allergies  Allergen Reactions   Lactose Intolerance (Gi) Other (See Comments)    GI side effects   Latex     Makes face itchy   Penicillins     Unspecified reaction as an infant    Medication list has been reviewed and updated.  Current Outpatient Medications on File Prior to Visit  Medication Sig Dispense Refill   albuterol (VENTOLIN HFA) 108 (90 Base) MCG/ACT inhaler Inhale into the lungs every 6 (six) hours as needed for wheezing or shortness of breath.     aspirin 81 MG tablet Take 81 mg by mouth daily. Reported on 02/23/2016     atorvastatin (LIPITOR) 10 MG tablet TAKE 1 TABLET BY MOUTH EVERY DAY 90  tablet 0   Azelaic Acid 15 % gel Apply 1 application topically 2 (two) times daily.     budesonide-formoterol (SYMBICORT) 80-4.5 MCG/ACT inhaler Inhale 2 puffs into the lungs 2 (two) times daily.     Cholecalciferol (VITAMIN D3) 2000 units capsule Take 2,000 Units by mouth daily.     ESTRADIOL PO Take by mouth.     loratadine (CLARITIN) 10 MG tablet Take 10 mg by mouth as needed.      losartan (COZAAR) 50 MG tablet TAKE 1 TABLET BY MOUTH TWICE A DAY 180 tablet 0   MAGNESIUM PO Take by mouth daily.     Misc Natural Products (ADRENAL PO) Take 1 tablet by mouth daily.      montelukast (SINGULAIR) 10 MG tablet TAKE 1 TABLET BY MOUTH EVERYDAY AT BEDTIME 90 tablet 0   NON FORMULARY retna cream for face     Omega-3 Fatty Acids (FISH OIL PO) Take 1 tablet by mouth daily.      OVER THE COUNTER MEDICATION Pro-biotic 1 capsule daily.     pantoprazole (PROTONIX) 40 MG tablet TAKE 1 TABLET BY MOUTH EVERY DAY 90 tablet 0   progesterone (PROMETRIUM) 200 MG capsule Take 200 mg by mouth at bedtime.     tretinoin (RETIN-A) 0.025 % cream Apply 1 application topically at bedtime.     venlafaxine XR (EFFEXOR-XR) 150 MG 24 hr capsule TAKE 1 CAPSULE BY MOUTH DAILY WITH BREAKFAST. 90 capsule 3   No current facility-administered medications on file prior to visit.    Review of Systems:  As per HPI- otherwise negative.   Physical Examination: There were no vitals filed for this visit. There were no vitals filed for this visit. There is no height or weight on file to calculate BMI. Ideal Body Weight:    GEN: no acute distress. HEENT: Atraumatic, Normocephalic.  Ears and Nose: No external deformity. CV: RRR, No M/G/R. No JVD. No thrill. No extra heart sounds. PULM: CTA B, no wheezes, crackles, rhonchi. No retractions. No resp. distress. No accessory muscle use. ABD: S, NT, ND, +BS. No rebound. No HSM. EXTR: No c/c/e PSYCH: Normally interactive. Conversant.    Assessment and  Plan: ***  Signed Lamar Blinks, MD

## 2023-02-08 ENCOUNTER — Encounter: Payer: 59 | Admitting: Family Medicine

## 2023-02-08 DIAGNOSIS — Z131 Encounter for screening for diabetes mellitus: Secondary | ICD-10-CM

## 2023-02-08 DIAGNOSIS — Z1329 Encounter for screening for other suspected endocrine disorder: Secondary | ICD-10-CM

## 2023-02-08 DIAGNOSIS — E785 Hyperlipidemia, unspecified: Secondary | ICD-10-CM

## 2023-02-08 DIAGNOSIS — I10 Essential (primary) hypertension: Secondary | ICD-10-CM

## 2023-02-08 DIAGNOSIS — Z0000 Encounter for general adult medical examination without abnormal findings: Secondary | ICD-10-CM

## 2023-02-08 DIAGNOSIS — R5383 Other fatigue: Secondary | ICD-10-CM

## 2023-02-22 ENCOUNTER — Encounter: Payer: 59 | Admitting: Family Medicine

## 2023-03-04 NOTE — Patient Instructions (Signed)
It was great to see you again today, I will be in touch with your labs soon as possible If not done already, it looks like you are due for a follow-up colonoscopy.  Also recommend COVID-19 booster if not completed in the last 6 to 9 months

## 2023-03-04 NOTE — Progress Notes (Signed)
Arrived too late to be seen, rescheduled

## 2023-03-07 ENCOUNTER — Ambulatory Visit: Payer: 59 | Admitting: Family Medicine

## 2023-03-07 DIAGNOSIS — Z1329 Encounter for screening for other suspected endocrine disorder: Secondary | ICD-10-CM

## 2023-03-07 DIAGNOSIS — Z91199 Patient's noncompliance with other medical treatment and regimen due to unspecified reason: Secondary | ICD-10-CM

## 2023-03-07 DIAGNOSIS — I10 Essential (primary) hypertension: Secondary | ICD-10-CM

## 2023-03-07 DIAGNOSIS — F325 Major depressive disorder, single episode, in full remission: Secondary | ICD-10-CM

## 2023-03-07 DIAGNOSIS — Z0000 Encounter for general adult medical examination without abnormal findings: Secondary | ICD-10-CM

## 2023-03-07 DIAGNOSIS — R5383 Other fatigue: Secondary | ICD-10-CM

## 2023-03-07 DIAGNOSIS — Z131 Encounter for screening for diabetes mellitus: Secondary | ICD-10-CM

## 2023-03-07 DIAGNOSIS — E785 Hyperlipidemia, unspecified: Secondary | ICD-10-CM

## 2023-03-07 DIAGNOSIS — E669 Obesity, unspecified: Secondary | ICD-10-CM

## 2023-03-14 ENCOUNTER — Encounter: Payer: 59 | Admitting: Family Medicine

## 2023-03-14 NOTE — Progress Notes (Unsigned)
03/15/2023 Misty [name] [id number] 11-07-1966  Referring provider: Pearline Cables, MD Primary GI doctor: Dr. Rhea Belton  ASSESSMENT AND PLAN:   History of colonic polyps Patient was due 08/2022, she has no complaints at this time.  Was set up a visit due to aspirin.  No charge for today's visit. We have discussed the risks of bleeding, infection, perforation, medication reactions, and remote risk of death associated with colonoscopy. All questions were answered and the patient acknowledges these risk and wishes to proceed.  Gastroesophageal reflux disease, unspecified whether esophagitis present Well-controlled with weight loss and diet. No dysphagia, melena or reflux issues.   Patient Care Team: Copland, Gwenlyn Found, MD as PCP - General (Family Medicine) Sidney Ace, MD as Referring Physician (Allergy)  HISTORY OF PRESENT ILLNESS: 57 y.o. female with a past medical history of GERD, history of esophageal ulceration versus mucosal tear 2015, sessile serrated colon polyp, constipation, GERD and others listed below presents for evaluation of colonoscopy.   08/03/2014 upper endoscopy single nonbleeding linear ulceration versus mucosal tear over a 7 to 8 cm segment of mid esophagus, 2 cm hiatal hernia question nonobstructing Schatzki ring stomach and duodenum normal 2018 colonoscopy assess polyp without dysplasia recall 08/2022 03/30/2021 endoscopy For worsening reflux despite PPI showed LA grade C esophagitis 4 cm hiatal hernia,  Hill grade 4, normal stomach normal duodenum.  Pathology showed no EOE, negative H. pylori, showed reflux esophagitis.  Recommended Protonix twice daily and follow-up.  She has lost weight and is eating healthy so her GERD is well controlled.She take protonix as needed.  No dysphagia.  She has normal Bm's for her, normal formed Bm's or every 2 days.  No hematochezia.   She reports blood thinner use, just bASA.  She denies NSAID use.  She reports ETOH  use rare.   She denies tobacco use.  She denies drug use.    She  reports that she has never smoked. She has never used smokeless tobacco. She reports current alcohol use of about 2.0 standard drinks of alcohol per week. She reports that she does not use drugs.  RELEVANT LABS AND IMAGING: CBC    Component Value Date/Time   WBC 6.3 01/25/2022 0946   RBC 3.91 01/25/2022 0946   HGB 12.3 01/25/2022 0946   HCT 36.5 01/25/2022 0946   PLT 266.0 01/25/2022 0946   MCV 93.4 01/25/2022 0946   MCH 31.6 07/30/2014 2016   MCHC 33.8 01/25/2022 0946   RDW 13.6 01/25/2022 0946   LYMPHSABS 1.2 12/29/2020 1451   MONOABS 0.5 12/29/2020 1451   EOSABS 0.5 12/29/2020 1451   BASOSABS 0.1 12/29/2020 1451   No results for input(s): "HGB" in the last 8760 hours.  CMP     Component Value Date/Time   NA 141 01/25/2022 0946   K 4.4 01/25/2022 0946   CL 105 01/25/2022 0946   CO2 29 01/25/2022 0946   GLUCOSE 123 (H) 01/25/2022 0946   BUN 9 01/25/2022 0946   CREATININE 0.64 01/25/2022 0946   CALCIUM 9.1 01/25/2022 0946   PROT 6.3 01/25/2022 0946   ALBUMIN 4.2 01/25/2022 0946   AST 15 01/25/2022 0946   ALT 23 01/25/2022 0946   ALKPHOS 96 01/25/2022 0946   BILITOT 0.4 01/25/2022 0946   GFRNONAA >90 07/30/2014 2016   GFRAA >90 07/30/2014 2016      Latest Ref Rng & Units 01/25/2022    9:46 AM 12/29/2020    2:51 PM 12/31/2019   11:31 AM  Hepatic Function  Total Protein 6.0 - 8.3 g/dL 6.3  6.5  6.8   Albumin 3.5 - 5.2 g/dL 4.2  4.2  4.4   AST 0 - 37 U/L ALT 0 - 35 U/L Alk Phosphatase 39 - 117 U/L 96  72  65   Total Bilirubin 0.2 - 1.2 mg/dL 0.4  0.5  0.5       Current Medications:   Current Outpatient Medications (Endocrine & Metabolic):    ESTRADIOL PO, Take by mouth.   progesterone (PROMETRIUM) 200 MG capsule, Take 200 mg by mouth at bedtime.  Current Outpatient Medications (Cardiovascular):    atorvastatin (LIPITOR) 10 MG tablet, TAKE 1 TABLET BY MOUTH EVERY DAY    losartan (COZAAR) 50 MG tablet, TAKE 1 TABLET BY MOUTH TWICE A DAY  Current Outpatient Medications (Respiratory):    albuterol (VENTOLIN HFA) 108 (90 Base) MCG/ACT inhaler, Inhale into the lungs every 6 (six) hours as needed for wheezing or shortness of breath.   budesonide-formoterol (SYMBICORT) 80-4.5 MCG/ACT inhaler, Inhale 2 puffs into the lungs 2 (two) times daily.   loratadine (CLARITIN) 10 MG tablet, Take 10 mg by mouth as needed.    montelukast (SINGULAIR) 10 MG tablet, TAKE 1 TABLET BY MOUTH EVERYDAY AT BEDTIME  Current Outpatient Medications (Analgesics):    aspirin 81 MG tablet, Take 81 mg by mouth daily. Reported on 02/23/2016   Current Outpatient Medications (Other):    Azelaic Acid 15 % gel, Apply 1 application topically 2 (two) times daily.   Cholecalciferol (VITAMIN D3) 2000 units capsule, Take 2,000 Units by mouth daily.   MAGNESIUM PO, Take by mouth daily.   Misc Natural Products (ADRENAL PO), Take 1 tablet by mouth daily.    NON FORMULARY, retna cream for face   Omega-3 Fatty Acids (FISH OIL PO), Take 1 tablet by mouth daily.    OVER THE COUNTER MEDICATION, Pro-biotic 1 capsule daily.   pantoprazole (PROTONIX) 40 MG tablet, TAKE 1 TABLET BY MOUTH EVERY DAY   tretinoin (RETIN-A) 0.025 % cream, Apply 1 application topically at bedtime.   valACYclovir (VALTREX) 500 MG tablet, Take 500 mg by mouth daily.   venlafaxine XR (EFFEXOR-XR) 150 MG 24 hr capsule, TAKE 1 CAPSULE BY MOUTH DAILY WITH BREAKFAST.  Medical History:  Past Medical History:  Diagnosis Date   Acne    from IUD/hormonal replacement   Allergy    seasonal   Anxiety    Anxiety and depression    Asthma    Depression    Diverticulosis    Esophageal ulcer    by EGD 08-03-2014   GERD (gastroesophageal reflux disease)    Hiatal hernia    Hypertension    IUD (intrauterine device) in place    Lactose intolerance    Nonspecific abnormal results of thyroid function study    Osteoarthritis    Dr.Collins     Other and unspecified hyperlipidemia    Sessile colonic polyp    Thyroid disease    Allergies:  Allergies  Allergen Reactions   Lactose Intolerance (Gi) Other (See Comments)    GI side effects   Latex     Makes face itchy   Penicillins     Unspecified reaction as an infant     Surgical History:  She  has a past surgical history that includes Tonsillectomy and adenoidectomy; Ganglion cyst excision; Bilateral knee arthroscopy; G 1 P 1; spirometry (01/2016); LEEP (03/15/2018);  and right knee replacement 2021. Family History:  Her family history includes Breast cancer in her paternal grandmother; COPD in her mother and sister; Celiac disease in her sister; Diabetes in her maternal aunt, sister, and sister; Eczema in her daughter; Fibromyalgia in her sister; Heart attack (age of onset: 19) in her father; Heart attack (age of onset: 51) in her brother; Heart disease in her mother; Hyperlipidemia in her brother, sister, and sister; Hypertension in her brother, father, sister, sister, and sister; Lung cancer in her maternal grandmother; Lung disease in her father; Osteopenia in her sister; Rheum arthritis in her sister; Stroke in her maternal grandfather.  REVIEW OF SYSTEMS  : All other systems reviewed and negative except where noted in the History of Present Illness.  PHYSICAL EXAM: BP 122/70   Pulse (!) 104   Ht  (1.676 m)   Wt 178 lb (80.7 kg)   BMI 28.73 kg/m  General Appearance: Well nourished, in no apparent distress. Head:   Normocephalic and atraumatic. Eyes:  sclerae anicteric,conjunctive pink  Respiratory: Respiratory effort normal, BS equal bilaterally without rales, rhonchi, wheezing. Cardio: RRR with no MRGs. Peripheral pulses intact.  Abdomen: Soft,  Non-distended ,active bowel sounds. No tenderness . Without guarding and Without rebound. No masses. Rectal: Not evaluated Musculoskeletal: Full ROM, Normal gait. With edema. Skin:  Dry and intact without significant  lesions or rashes Neuro: Alert and  oriented x4;  No focal deficits. Psych:  Cooperative. Normal mood and affect.    Doree Albee, PA-C 2:58 PM

## 2023-03-15 ENCOUNTER — Encounter: Payer: Self-pay | Admitting: Physician Assistant

## 2023-03-15 ENCOUNTER — Ambulatory Visit (INDEPENDENT_AMBULATORY_CARE_PROVIDER_SITE_OTHER): Payer: 59 | Admitting: Physician Assistant

## 2023-03-15 VITALS — BP 122/70 | HR 104 | Ht 66.0 in | Wt 178.0 lb

## 2023-03-15 DIAGNOSIS — Z86010 Personal history of colonic polyps: Secondary | ICD-10-CM

## 2023-03-15 DIAGNOSIS — K219 Gastro-esophageal reflux disease without esophagitis: Secondary | ICD-10-CM

## 2023-03-15 DIAGNOSIS — Z01419 Encounter for gynecological examination (general) (routine) without abnormal findings: Secondary | ICD-10-CM | POA: Diagnosis not present

## 2023-03-15 DIAGNOSIS — Z1231 Encounter for screening mammogram for malignant neoplasm of breast: Secondary | ICD-10-CM | POA: Diagnosis not present

## 2023-03-15 DIAGNOSIS — N950 Postmenopausal bleeding: Secondary | ICD-10-CM | POA: Diagnosis not present

## 2023-03-15 DIAGNOSIS — Z13820 Encounter for screening for osteoporosis: Secondary | ICD-10-CM | POA: Diagnosis not present

## 2023-03-15 DIAGNOSIS — N952 Postmenopausal atrophic vaginitis: Secondary | ICD-10-CM | POA: Diagnosis not present

## 2023-03-15 DIAGNOSIS — Z6829 Body mass index (BMI) 29.0-29.9, adult: Secondary | ICD-10-CM | POA: Diagnosis not present

## 2023-03-15 NOTE — Patient Instructions (Addendum)
It has been recommended to you by your physician that you have a colonoscopy completed. Per your request, we did not schedule the procedure(s) today. Please contact our office at [phone] should you decide to have the procedure completed. You will be scheduled for a pre-visit and procedure at that time.     Check out lipoprotein A Coronary calcium score.    _______________________________________________________  If your blood pressure at your visit was 140/90 or greater, please contact your primary care physician to follow up on this.  _______________________________________________________  If you are age 45 or older, your body mass index should be between 23-30. Your Body mass index is 28.73 kg/m. If this is out of the aforementioned range listed, please consider follow up with your Primary Care Provider.  If you are age 20 or younger, your body mass index should be between 19-25. Your Body mass index is 28.73 kg/m. If this is out of the aformentioned range listed, please consider follow up with your Primary Care Provider.   __________________________________________________________  The [facility] providers would like to encourage you to use [facility] to communicate with providers for non-urgent requests or questions.  Due to long hold times on the telephone, sending your provider a message by [facility] may be a faster and more efficient way to get a response.  Please allow 48 business hours for a response.  Please remember that this is for non-urgent requests.   Due to recent changes in healthcare laws, you may see the results of your imaging and laboratory studies on MyChart before your provider has had a chance to review them.  We understand that in some cases there may be results that are confusing or concerning to you. Not all laboratory results come back in the same time frame and the provider may be waiting for multiple results in order to interpret others.  Please give [place] 48 hours  in order for your provider to thoroughly review all the results before contacting the office for clarification of your results.    Thank you for choosing me and [facility].

## 2023-03-17 ENCOUNTER — Other Ambulatory Visit: Payer: Self-pay | Admitting: Family Medicine

## 2023-03-17 DIAGNOSIS — I10 Essential (primary) hypertension: Secondary | ICD-10-CM

## 2023-03-20 ENCOUNTER — Other Ambulatory Visit: Payer: Self-pay | Admitting: Obstetrics and Gynecology

## 2023-03-20 DIAGNOSIS — R928 Other abnormal and inconclusive findings on diagnostic imaging of breast: Secondary | ICD-10-CM

## 2023-03-23 ENCOUNTER — Encounter: Payer: Self-pay | Admitting: Internal Medicine

## 2023-03-29 ENCOUNTER — Ambulatory Visit
Admission: RE | Admit: 2023-03-29 | Discharge: 2023-03-29 | Disposition: A | Discharge status: Home / Self Care | Payer: 59 | Source: Ambulatory Visit | Attending: Obstetrics and Gynecology | Admitting: Obstetrics and Gynecology

## 2023-03-29 ENCOUNTER — Ambulatory Visit: Payer: 59

## 2023-03-29 DIAGNOSIS — R928 Other abnormal and inconclusive findings on diagnostic imaging of breast: Secondary | ICD-10-CM

## 2023-03-29 DIAGNOSIS — N6489 Other specified disorders of breast: Secondary | ICD-10-CM | POA: Diagnosis not present

## 2023-03-29 DIAGNOSIS — Z3202 Encounter for pregnancy test, result negative: Secondary | ICD-10-CM | POA: Diagnosis not present

## 2023-03-29 DIAGNOSIS — N879 Dysplasia of cervix uteri, unspecified: Secondary | ICD-10-CM | POA: Diagnosis not present

## 2023-03-29 DIAGNOSIS — R921 Mammographic calcification found on diagnostic imaging of breast: Secondary | ICD-10-CM | POA: Diagnosis not present

## 2023-04-05 ENCOUNTER — Encounter: Payer: 59 | Admitting: Family Medicine

## 2023-04-09 NOTE — Progress Notes (Unsigned)
Healthcare at [facility] [address], [address] [place], [place] [address] [phone]  Date:  04/11/2023   Name:  Misty [name]   DOB:  1965/12/13   MRN:  [id number]  PCP:  Pearline Cables, MD    Chief Complaint: No chief complaint on file.   History of Present Illness:  Misty [name] is a 57 y.o. very pleasant female patient who presents with the following:  Patient seen today for physical exam Most recent visit with myself was about 1 year ago History of hyperlipidemia, esophageal ulcer, asthma, OA/ polyarthralgia treated by rheumatology/ Dr Thomasena Edis at [facility]  She has knee arthritis, status post right total knee and being treated with steroid injections for left knee pain  Her GYN is Dr.Tomblin Pap smear 4/23 Mammogram 4/23 Colon cancer screening appears to be due   Aspirin 81 Lipitor 10 Losartan 50 twice daily Singulair Venlafaxine Symbicort?  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   LACTOSE INTOLERANCE 03/24/2008   Asthma, f/u by her allergist  03/24/2008   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    Past Medical History:  Diagnosis Date   Acne    from IUD/hormonal replacement   Allergy    seasonal   Anxiety    Anxiety and depression    Asthma    Depression    Diverticulosis    Esophageal ulcer    by EGD 08-03-2014   GERD (gastroesophageal reflux disease)    Hiatal hernia    Hypertension    IUD (intrauterine device) in place    Lactose intolerance    Nonspecific abnormal results of thyroid function study    Osteoarthritis    Dr.Collins    Other and unspecified hyperlipidemia    Sessile colonic polyp    Thyroid disease     Past Surgical  History:  Procedure Laterality Date   BILATERAL KNEE ARTHROSCOPY     Left: 11/10/2008, Right: 04/15/2009. Dr.Andy Collins   G 1 P 1     GANGLION CYST EXCISION     Right Wrist    LEEP  03/15/2018   right knee replacement 2021     SPIROMETRY  01/2016   TONSILLECTOMY AND ADENOIDECTOMY      Social History   Tobacco Use   Smoking status: Never   Smokeless tobacco: Never  Vaping Use   Vaping Use: Never used  Substance Use Topics   Alcohol use: Yes    Alcohol/week: 2.0 standard drinks of alcohol    Types: 2 Glasses of wine per week    Comment: Socially   Drug use: No    Family History  Problem Relation Age of Onset   COPD Mother        smoker   Heart disease Mother        stent @ 57; ablation for tachycardia   Heart attack Father 37   Lung disease Father        mesothelioma   Hypertension Father    Rheum arthritis Sister    Hyperlipidemia Sister    Hypertension Sister    Diabetes Maternal Aunt    Lung cancer Maternal Grandmother    Stroke Maternal Grandfather    Breast cancer Paternal Grandmother  Osteopenia Sister    Fibromyalgia Sister    Hyperlipidemia Sister    Celiac disease Sister    Diabetes Sister    Hypertension Sister    Heart attack Brother 94   Hypertension Brother    Hyperlipidemia Brother    Diabetes Sister    COPD Sister    Hypertension Sister    Eczema Daughter    Esophageal cancer Neg Hx    Stomach cancer Neg Hx    Colon cancer Neg Hx    Rectal cancer Neg Hx     Allergies  Allergen Reactions   Lactose Intolerance (Gi) Other (See Comments)    GI side effects   Latex     Makes face itchy   Penicillins     Unspecified reaction as an infant    Medication list has been reviewed and updated.  Current Outpatient Medications on File Prior to Visit  Medication Sig Dispense Refill   albuterol (VENTOLIN HFA) 108 (90 Base) MCG/ACT inhaler Inhale into the lungs every 6 (six) hours as needed for wheezing or shortness of breath.     aspirin 81  MG tablet Take 81 mg by mouth daily. Reported on 02/23/2016     atorvastatin (LIPITOR) 10 MG tablet TAKE 1 TABLET BY MOUTH EVERY DAY 90 tablet 0   Azelaic Acid 15 % gel Apply 1 application topically 2 (two) times daily.     budesonide-formoterol (SYMBICORT) 80-4.5 MCG/ACT inhaler Inhale 2 puffs into the lungs 2 (two) times daily.     Cholecalciferol (VITAMIN D3) 2000 units capsule Take 2,000 Units by mouth daily.     ESTRADIOL PO Take by mouth.     loratadine (CLARITIN) 10 MG tablet Take 10 mg by mouth as needed.      losartan (COZAAR) 50 MG tablet TAKE 1 TABLET BY MOUTH TWICE A DAY 180 tablet 0   MAGNESIUM PO Take by mouth daily.     Misc Natural Products (ADRENAL PO) Take 1 tablet by mouth daily.      montelukast (SINGULAIR) 10 MG tablet TAKE 1 TABLET BY MOUTH EVERYDAY AT BEDTIME 90 tablet 0   NON FORMULARY retna cream for face     Omega-3 Fatty Acids (FISH OIL PO) Take 1 tablet by mouth daily.      OVER THE COUNTER MEDICATION Pro-biotic 1 capsule daily.     pantoprazole (PROTONIX) 40 MG tablet TAKE 1 TABLET BY MOUTH EVERY DAY 90 tablet 0   progesterone (PROMETRIUM) 200 MG capsule Take 200 mg by mouth at bedtime.     tretinoin (RETIN-A) 0.025 % cream Apply 1 application topically at bedtime.     valACYclovir (VALTREX) 500 MG tablet Take 500 mg by mouth daily.     venlafaxine XR (EFFEXOR-XR) 150 MG 24 hr capsule TAKE 1 CAPSULE BY MOUTH DAILY WITH BREAKFAST. 90 capsule 3   No current facility-administered medications on file prior to visit.    Review of Systems:  As per HPI- otherwise negative.   Physical Examination: There were no vitals filed for this visit. There were no vitals filed for this visit. There is no height or weight on file to calculate BMI. Ideal Body Weight:    GEN: no acute distress. HEENT: Atraumatic, Normocephalic.  Ears and Nose: No external deformity. CV: RRR, No M/G/R. No JVD. No thrill. No extra heart sounds. PULM: CTA B, no wheezes, crackles, rhonchi. No  retractions. No resp. distress. No accessory muscle use. ABD: S, NT, ND, +BS. No rebound. No HSM. EXTR: No  c/c/e PSYCH: Normally interactive. Conversant.    Assessment and Plan: *** Physical exam today.  Encouraged healthy diet and exercise routine Will plan further follow- up pending labs.  Signed Abbe Amsterdam, MD

## 2023-04-09 NOTE — Patient Instructions (Signed)
It was great to see you again today, I will be in touch with your labs soon as possible If not done already, it looks like you are due for a follow-up colonoscopy.  Also recommend COVID-19 booster if not completed in the last 6 to 9 months     We will set up a coronary calcium for you- I will be in touch with your results   Keep up the good work with diet and exercise!   Rogaine foam may be helpful for improving hair volume for you

## 2023-04-11 ENCOUNTER — Ambulatory Visit (INDEPENDENT_AMBULATORY_CARE_PROVIDER_SITE_OTHER): Payer: 59 | Admitting: Family Medicine

## 2023-04-11 ENCOUNTER — Encounter: Payer: Self-pay | Admitting: Family Medicine

## 2023-04-11 VITALS — BP 122/80 | HR 84 | Temp 98.3°F | Resp 18 | Ht 66.0 in | Wt 182.8 lb

## 2023-04-11 DIAGNOSIS — Z0000 Encounter for general adult medical examination without abnormal findings: Secondary | ICD-10-CM

## 2023-04-11 DIAGNOSIS — I10 Essential (primary) hypertension: Secondary | ICD-10-CM

## 2023-04-11 DIAGNOSIS — Z131 Encounter for screening for diabetes mellitus: Secondary | ICD-10-CM | POA: Diagnosis not present

## 2023-04-11 DIAGNOSIS — Z8249 Family history of ischemic heart disease and other diseases of the circulatory system: Secondary | ICD-10-CM

## 2023-04-11 DIAGNOSIS — R5383 Other fatigue: Secondary | ICD-10-CM | POA: Diagnosis not present

## 2023-04-11 DIAGNOSIS — Z6839 Body mass index (BMI) 39.0-39.9, adult: Secondary | ICD-10-CM

## 2023-04-11 DIAGNOSIS — E7849 Other hyperlipidemia: Secondary | ICD-10-CM | POA: Diagnosis not present

## 2023-04-11 DIAGNOSIS — E669 Obesity, unspecified: Secondary | ICD-10-CM | POA: Diagnosis not present

## 2023-04-11 DIAGNOSIS — Z9109 Other allergy status, other than to drugs and biological substances: Secondary | ICD-10-CM

## 2023-04-11 DIAGNOSIS — E785 Hyperlipidemia, unspecified: Secondary | ICD-10-CM | POA: Diagnosis not present

## 2023-04-11 DIAGNOSIS — F325 Major depressive disorder, single episode, in full remission: Secondary | ICD-10-CM

## 2023-04-11 DIAGNOSIS — L659 Nonscarring hair loss, unspecified: Secondary | ICD-10-CM | POA: Diagnosis not present

## 2023-04-11 DIAGNOSIS — Z1329 Encounter for screening for other suspected endocrine disorder: Secondary | ICD-10-CM

## 2023-04-11 LAB — TSH: TSH: 0.91 u[IU]/mL (ref 0.35–5.50)

## 2023-04-11 LAB — LIPID PANEL
Cholesterol: 156 mg/dL (ref 0–200)
HDL: 69.1 mg/dL (ref >39.00)
LDL Cholesterol: 77 mg/dL (ref 0–99)
NonHDL: 87.27
Total CHOL/HDL Ratio: 2
Triglycerides: 49 mg/dL (ref 0.0–149.0)
VLDL: 9.8 mg/dL (ref 0.0–40.0)

## 2023-04-11 LAB — COMPREHENSIVE METABOLIC PANEL
ALT: 19 U/L (ref 0–35)
AST: 14 U/L (ref 0–37)
Albumin: 4 g/dL (ref 3.5–5.2)
Alkaline Phosphatase: 72 U/L (ref 39–117)
BUN: 10 mg/dL (ref 6–23)
CO2: 29 mEq/L (ref 19–32)
Calcium: 9 mg/dL (ref 8.4–10.5)
Chloride: 106 mEq/L (ref 96–112)
Creatinine, Ser: 0.59 mg/dL (ref 0.40–1.20)
GFR: 100.99 mL/min (ref >60.00)
Glucose, Bld: 94 mg/dL (ref 70–99)
Potassium: 4.2 mEq/L (ref 3.5–5.1)
Sodium: 141 mEq/L (ref 135–145)
Total Bilirubin: 0.4 mg/dL (ref 0.2–1.2)
Total Protein: 6.1 g/dL (ref 6.0–8.3)

## 2023-04-11 LAB — HEMOGLOBIN A1C: Hgb A1c MFr Bld: 5.4 % (ref 4.6–6.5)

## 2023-04-11 LAB — CBC
HCT: 36 % (ref 36.0–46.0)
Hemoglobin: 12.1 g/dL (ref 12.0–15.0)
MCHC: 33.7 g/dL (ref 30.0–36.0)
MCV: 100.8 fl — ABNORMAL HIGH (ref 78.0–100.0)
Platelets: 228 10*3/uL (ref 150.0–400.0)
RBC: 3.57 Mil/uL — ABNORMAL LOW (ref 3.87–5.11)
RDW: 12.7 % (ref 11.5–15.5)
WBC: 5.2 10*3/uL (ref 4.0–10.5)

## 2023-04-11 LAB — FERRITIN: Ferritin: 35.4 ng/mL (ref 10.0–291.0)

## 2023-04-11 LAB — VITAMIN D 25 HYDROXY (VIT D DEFICIENCY, FRACTURES): VITD: 71.98 ng/mL (ref 30.00–100.00)

## 2023-04-11 MED ORDER — MONTELUKAST SODIUM 10 MG PO TABS: ORAL_TABLET | ORAL | 3 refills | Status: DC

## 2023-04-11 MED ORDER — LOSARTAN POTASSIUM 50 MG PO TABS
50.0000 mg | ORAL_TABLET | Freq: Two times a day (BID) | ORAL | 3 refills | Status: DC
Start: 2023-04-11 — End: 2024-06-09

## 2023-04-11 MED ORDER — ATORVASTATIN CALCIUM 10 MG PO TABS: 10.0000 mg | ORAL_TABLET | Freq: Every day | ORAL | 3 refills | Status: DC

## 2023-04-11 MED ORDER — VENLAFAXINE HCL ER 150 MG PO CP24: 150.0000 mg | ORAL_CAPSULE | Freq: Every day | ORAL | 3 refills | Status: DC

## 2023-04-20 ENCOUNTER — Ambulatory Visit (HOSPITAL_BASED_OUTPATIENT_CLINIC_OR_DEPARTMENT_OTHER)
Admission: RE | Admit: 2023-04-20 | Discharge: 2023-04-20 | Disposition: A | Discharge status: Home / Self Care | Payer: 59 | Source: Ambulatory Visit | Attending: Family Medicine | Admitting: Family Medicine

## 2023-04-20 ENCOUNTER — Encounter: Payer: Self-pay | Admitting: Family Medicine

## 2023-04-20 DIAGNOSIS — R931 Abnormal findings on diagnostic imaging of heart and coronary circulation: Secondary | ICD-10-CM

## 2023-04-20 DIAGNOSIS — Z8249 Family history of ischemic heart disease and other diseases of the circulatory system: Secondary | ICD-10-CM | POA: Insufficient documentation

## 2023-05-01 DIAGNOSIS — M25562 Pain in left knee: Secondary | ICD-10-CM | POA: Diagnosis not present

## 2023-05-17 ENCOUNTER — Telehealth: Payer: Self-pay | Admitting: Family Medicine

## 2023-05-17 NOTE — Telephone Encounter (Signed)
Pt called stating that she was having an issue with her CPE visit on 5.22.24 not being covered. After reviewing possible issues with the visit, noticed that a preventative visit was filed for 4.17.24 when pt was not seen on that day due to a late arrival. After consulting with Marylene Land, advised pt that this would be sent back to manager for review and correction and we would give her a call with an update once available.

## 2023-06-01 ENCOUNTER — Ambulatory Visit (AMBULATORY_SURGERY_CENTER): Payer: 59 | Admitting: *Deleted

## 2023-06-01 ENCOUNTER — Telehealth: Payer: Self-pay | Admitting: *Deleted

## 2023-06-01 ENCOUNTER — Encounter: Payer: Self-pay | Admitting: Internal Medicine

## 2023-06-01 VITALS — Ht 66.0 in | Wt 174.0 lb

## 2023-06-01 DIAGNOSIS — Z85038 Personal history of other malignant neoplasm of large intestine: Secondary | ICD-10-CM

## 2023-06-01 MED ORDER — NA SULFATE-K SULFATE-MG SULF 17.5-3.13-1.6 GM/177ML PO SOLN
1.0000 | Freq: Once | ORAL | 0 refills | Status: AC
Start: 2023-06-01 — End: 2023-06-01

## 2023-06-01 NOTE — Telephone Encounter (Signed)
Attempt to reach pt for pre-visit. LM with call back #. And instructions that RN will attempt again in 5 min and if does not reach she is to call # given by end of day and reschedule pre-visit or per protocol pre-visit and procedure will be canceled. Will attempt to reach again in 5 min due to no other # listed in profile

## 2023-06-01 NOTE — Progress Notes (Addendum)
Pt's name and DOB verified at the beginning of the pre-visit.  Pt denies any difficulty with ambulating,sitting, laying down or rolling side to side Gave both [facility] main # and MD on call # prior to instructions.  No egg or soy allergy known to patient  No issues known to pt with past sedation with any surgeries or procedures Pt denies having issues being intubated Pt has no issues moving head neck or swallowing No FH of Malignant Hyperthermia Pt is not on diet pills Pt is not on home 02  Pt is not on blood thinners  Pt denies issues with constipation  Pt is not on dialysis Pt denise any abnormal heart rhythms  Pt has  upcoming cardiologist OV due to elevated labs but states no issues cardiac wise Pt encouraged to use to use Singlecare or Goodrx to reduce cost  Patient's chart reviewed by Cathlyn Parsons CNRA prior to pre-visit and patient appropriate for the [facility].  Pre-visit completed and red dot placed by patient's name on their procedure day (on provider's schedule).  . Visit by phone Pt states weight is 174 lb Instructed pt why it is important to and  to call if they have any changes in health or new medications. Directed them to the # given and on instructions.   Pt states they will.  Instructions reviewed with pt and pt states understanding. Instructed to review again prior to procedure. Pt states they will.  Instructions sent by mail with coupon and by my chart

## 2023-06-28 ENCOUNTER — Encounter: Payer: Self-pay | Admitting: Internal Medicine

## 2023-06-28 ENCOUNTER — Encounter: Payer: Self-pay | Admitting: Cardiovascular Disease

## 2023-06-28 ENCOUNTER — Ambulatory Visit (AMBULATORY_SURGERY_CENTER): Payer: 59 | Admitting: Internal Medicine

## 2023-06-28 VITALS — BP 106/76 | HR 87 | Temp 97.1°F | Resp 10 | Ht 66.0 in | Wt 174.0 lb

## 2023-06-28 DIAGNOSIS — K635 Polyp of colon: Secondary | ICD-10-CM

## 2023-06-28 DIAGNOSIS — Z09 Encounter for follow-up examination after completed treatment for conditions other than malignant neoplasm: Secondary | ICD-10-CM

## 2023-06-28 DIAGNOSIS — D125 Benign neoplasm of sigmoid colon: Secondary | ICD-10-CM

## 2023-06-28 DIAGNOSIS — Z86010 Personal history of colonic polyps: Secondary | ICD-10-CM

## 2023-06-28 DIAGNOSIS — I10 Essential (primary) hypertension: Secondary | ICD-10-CM | POA: Diagnosis not present

## 2023-06-28 DIAGNOSIS — Z1211 Encounter for screening for malignant neoplasm of colon: Secondary | ICD-10-CM | POA: Diagnosis not present

## 2023-06-28 DIAGNOSIS — J45909 Unspecified asthma, uncomplicated: Secondary | ICD-10-CM | POA: Diagnosis not present

## 2023-06-28 MED ORDER — SODIUM CHLORIDE 0.9 % IV SOLN
500.0000 mL | INTRAVENOUS | Status: DC
Start: 2023-06-28 — End: 2023-06-28

## 2023-06-28 NOTE — Progress Notes (Signed)
Report to PACU, RN, vss, BBS= Clear.  

## 2023-06-28 NOTE — Op Note (Signed)
[facility] Patient Name: Misty [name] Procedure Date: 06/28/2023 8:44 AM MRN: [id number] Endoscopist: Beverley Fiedler , MD, [id number] Age: 57 Referring MD:  Date of Birth: [birth year] Gender: Female Account #: [id number] Procedure:                Colonoscopy Indications:              High risk colon cancer surveillance: Personal                            history of sessile serrated colon polyp (less than                            10 mm in size) with no dysplasia, Last colonoscopy:                            October 2018 Medicines:                Monitored Anesthesia Care Procedure:                Pre-Anesthesia Assessment:                           - Prior to the procedure, a History and Physical                            was performed, and patient medications and                            allergies were reviewed. The patient's tolerance of                            previous anesthesia was also reviewed. The risks                            and benefits of the procedure and the sedation                            options and risks were discussed with the patient.                            All questions were answered, and informed consent                            was obtained. Prior Anticoagulants: The patient has                            taken no anticoagulant or antiplatelet agents. ASA                            Grade Assessment: II - A patient with mild systemic                            disease. After reviewing the risks and benefits,  the patient was deemed in satisfactory condition to                            undergo the procedure.                           After obtaining informed consent, the colonoscope                            was passed under direct vision. Throughout the                            procedure, the patient's blood pressure, pulse, and                            oxygen saturations were monitored continuously.  The                            Olympus Scope SN: [id number] was introduced through                            the anus and advanced to the cecum, identified by                            appendiceal orifice and ileocecal valve. The                            colonoscopy was performed without difficulty. The                            patient tolerated the procedure well. The quality                            of the bowel preparation was good. The ileocecal                            valve, appendiceal orifice, and rectum were                            photographed. Scope In: 8:54:35 AM Scope Out: 9:10:30 AM Scope Withdrawal Time: 0 hours 11 minutes 54 seconds  Total Procedure Duration: 0 hours 15 minutes 55 seconds  Findings:                 The digital rectal exam was normal.                           A 5 mm polyp was found in the sigmoid colon. The                            polyp was sessile. The polyp was removed with a                            cold snare. Resection and retrieval were complete.  Multiple small-mouthed diverticula were found in                            the sigmoid colon, descending colon, ascending                            colon and cecum.                           The retroflexed view of the distal rectum and anal                            verge was normal and showed no anal or rectal                            abnormalities. Complications:            No immediate complications. Estimated Blood Loss:     Estimated blood loss: none. Impression:               - One 5 mm polyp in the sigmoid colon, removed with                            a cold snare. Resected and retrieved.                           - Mild diverticulosis in the sigmoid colon, in the                            descending colon, in the ascending colon and in the                            cecum.                           - The distal rectum and anal verge are normal on                             retroflexion view. Recommendation:           - Patient has a contact number available for                            emergencies. The signs and symptoms of potential                            delayed complications were discussed with the                            patient. Return to normal activities tomorrow.                            Written discharge instructions were provided to the                            patient.                           -  Resume previous diet.                           - Continue present medications.                           - Metamucil or MiraLax can be tried OTC per bottle                            instruction for constipation.                           - Await pathology results.                           - Repeat colonoscopy is recommended for                            surveillance. The colonoscopy date will be                            determined after pathology results from today's                            exam become available for review. Beverley Fiedler, MD 06/28/2023 9:13:55 AM This report has been signed electronically.

## 2023-06-28 NOTE — Progress Notes (Signed)
GASTROENTEROLOGY PROCEDURE H&P NOTE   Primary Care Physician: Pearline Cables, MD    Reason for Procedure:  History of SSP  Plan:    Colonoscopy  Patient is appropriate for endoscopic procedure(s) in the ambulatory ([facility]) setting.  The nature of the procedure, as well as the risks, benefits, and alternatives were carefully and thoroughly reviewed with the patient. Ample time for discussion and questions allowed. The patient understood, was satisfied, and agreed to proceed.     HPI: Misty [name] is a 57 y.o. female who presents for surveillance colonoscopy.  Medical history as below.  Tolerated the prep.  No recent chest pain or shortness of breath.  No abdominal pain today.  Past Medical History:  Diagnosis Date   Acne    from IUD/hormonal replacement   Allergy    seasonal   Anxiety    Anxiety and depression    Asthma    Depression    Diverticulosis    Esophageal ulcer    by EGD 08-03-2014   GERD (gastroesophageal reflux disease)    Hiatal hernia    Hypertension    IUD (intrauterine device) in place    Lactose intolerance    Nonspecific abnormal results of thyroid function study    Osteoarthritis    Dr.Collins    Other and unspecified hyperlipidemia    Sessile colonic polyp    Thyroid disease     Past Surgical History:  Procedure Laterality Date   BILATERAL KNEE ARTHROSCOPY     Left: 11/10/2008, Right: 04/15/2009. Dr.Andy Collins   G 1 P 1     GANGLION CYST EXCISION     Right Wrist    LEEP  03/15/2018   right knee replacement 2021     SPIROMETRY  01/2016   TONSILLECTOMY AND ADENOIDECTOMY      Prior to Admission medications   Medication Sig Start Date End Date Taking? Authorizing Provider  aspirin 81 MG tablet Take 81 mg by mouth daily. Reported on 02/23/2016   Yes [provider]  atorvastatin (LIPITOR) 10 MG tablet Take 1 tablet (10 mg total) by mouth daily. 04/11/23  Yes Copland, Gwenlyn Found, MD  Azelaic Acid 15 % gel Apply 1 application  topically 2 (two) times daily. 03/04/21  Yes [provider]  Cholecalciferol (VITAMIN D3) 2000 units capsule Take 2,000 Units by mouth daily.   Yes [provider]  cyanocobalamin (VITAMIN B12) 500 MCG tablet Take 500 mcg by mouth daily.   Yes [provider]  ESTRADIOL PO Place 2 patches onto the skin 2 (two) times a week.   Yes [provider]  Fluticasone Furoate-Vilanterol (BREO ELLIPTA) 50-25 MCG/ACT AEPB INHALE 1 PUFF INTO THE LUNGS EVERY DAY FOR 30 DAYS   Yes [provider]  loratadine (CLARITIN) 10 MG tablet Take 10 mg by mouth as needed.    Yes [provider]  losartan (COZAAR) 50 MG tablet Take 1 tablet (50 mg total) by mouth 2 (two) times daily. 04/11/23  Yes Copland, Gwenlyn Found, MD  MAGNESIUM PO Take by mouth daily.   Yes [provider]  Misc Natural Products (ADRENAL PO) Take 1 tablet by mouth daily.    Yes [provider]  montelukast (SINGULAIR) 10 MG tablet TAKE 1 TABLET BY MOUTH EVERYDAY AT BEDTIME 04/11/23  Yes Copland, Gwenlyn Found, MD  Multiple Vitamin (MULTIVITAMIN) tablet Take 1 tablet by mouth daily.   Yes [provider]  NON FORMULARY retna cream for face   Yes  [provider]  Omega-3 Fatty Acids (FISH OIL PO) Take 1 tablet by mouth daily.    Yes [provider]  pantoprazole (PROTONIX) 40 MG tablet TAKE 1 TABLET BY MOUTH EVERY DAY 10/20/22  Yes Copland, Gwenlyn Found, MD  progesterone (PROMETRIUM) 200 MG capsule Take 200 mg by mouth at bedtime. 01/17/22  Yes [provider]  tretinoin (RETIN-A) 0.025 % cream Apply 1 application topically at bedtime. 03/24/21  Yes [provider]  venlafaxine XR (EFFEXOR-XR) 150 MG 24 hr capsule Take 1 capsule (150 mg total) by mouth daily with breakfast. 04/11/23  Yes Copland, Gwenlyn Found, MD  albuterol (VENTOLIN HFA) 108 (90 Base) MCG/ACT inhaler Inhale into the lungs every 6 (six) hours as needed for wheezing or shortness of breath.     [provider]  OVER THE COUNTER MEDICATION Pro-biotic 1 capsule daily. Patient not taking: Reported on 06/01/2023    [provider]  valACYclovir (VALTREX) 500 MG tablet Take 500 mg by mouth daily.    [provider]    Current Outpatient Medications  Medication Sig Dispense Refill   aspirin 81 MG tablet Take 81 mg by mouth daily. Reported on 02/23/2016     atorvastatin (LIPITOR) 10 MG tablet Take 1 tablet (10 mg total) by mouth daily. 90 tablet 3   Azelaic Acid 15 % gel Apply 1 application topically 2 (two) times daily.     Cholecalciferol (VITAMIN D3) 2000 units capsule Take 2,000 Units by mouth daily.     cyanocobalamin (VITAMIN B12) 500 MCG tablet Take 500 mcg by mouth daily.     ESTRADIOL PO Place 2 patches onto the skin 2 (two) times a week.     Fluticasone Furoate-Vilanterol (BREO ELLIPTA) 50-25 MCG/ACT AEPB INHALE 1 PUFF INTO THE LUNGS EVERY DAY FOR 30 DAYS     loratadine (CLARITIN) 10 MG tablet Take 10 mg by mouth as needed.      losartan (COZAAR) 50 MG tablet Take 1 tablet (50 mg total) by mouth 2 (two) times daily. 180 tablet 3   MAGNESIUM PO Take by mouth daily.     Misc Natural Products (ADRENAL PO) Take 1 tablet by mouth daily.      montelukast (SINGULAIR) 10 MG tablet TAKE 1 TABLET BY MOUTH EVERYDAY AT BEDTIME 90 tablet 3   Multiple Vitamin (MULTIVITAMIN) tablet Take 1 tablet by mouth daily.     NON FORMULARY retna cream for face     Omega-3 Fatty Acids (FISH OIL PO) Take 1 tablet by mouth daily.      pantoprazole (PROTONIX) 40 MG tablet TAKE 1 TABLET BY MOUTH EVERY DAY 90 tablet 0   progesterone (PROMETRIUM) 200 MG capsule Take 200 mg by mouth at bedtime.     tretinoin (RETIN-A) 0.025 % cream Apply 1 application topically at bedtime.     venlafaxine XR (EFFEXOR-XR) 150 MG 24 hr capsule Take 1 capsule (150 mg total) by mouth daily with breakfast. 90 capsule 3   albuterol (VENTOLIN HFA) 108 (90 Base) MCG/ACT inhaler Inhale into the lungs every 6  (six) hours as needed for wheezing or shortness of breath.     OVER THE COUNTER MEDICATION Pro-biotic 1 capsule daily. (Patient not taking: Reported on 06/01/2023)     valACYclovir (VALTREX) 500 MG tablet Take 500 mg by mouth daily.     Current Facility-Administered Medications  Medication Dose Route Frequency Provider Last Rate Last Admin   0.9 %  sodium chloride infusion  500 mL Intravenous Continuous Ericia Moxley,  Carie Caddy, MD        Allergies as of 06/28/2023 - Review Complete 06/28/2023  Allergen Reaction Noted   Lactose intolerance (gi) Other (See Comments) 06/21/2016   Latex  08/23/2017   Penicillins  03/19/2008    Family History  Problem Relation Age of Onset   COPD Mother        smoker   Heart disease Mother        stent @ 34; ablation for tachycardia   Heart attack Father 66   Lung disease Father        mesothelioma   Hypertension Father    Rheum arthritis Sister    Hyperlipidemia Sister    Hypertension Sister    Osteopenia Sister    Fibromyalgia Sister    Hyperlipidemia Sister    Celiac disease Sister    Diabetes Sister    Hypertension Sister    Diabetes Sister    COPD Sister    Hypertension Sister    Heart attack Brother 14   Hypertension Brother    Hyperlipidemia Brother    Diabetes Maternal Aunt    Lung cancer Maternal Grandmother    Stroke Maternal Grandfather    Breast cancer Paternal Grandmother    Eczema Daughter    Esophageal cancer Neg Hx    Stomach cancer Neg Hx    Colon cancer Neg Hx    Rectal cancer Neg Hx    Colon polyps Neg Hx     Social History   Socioeconomic History   Marital status: Single    Spouse name: Not on file   Number of children: 1   Years of education: Not on file   Highest education level: Not on file  Occupational History   Occupation: [employer], managment  Tobacco Use   Smoking status: Never   Smokeless tobacco: Never  Vaping Use   Vaping status: Never Used  Substance and Sexual Activity   Alcohol use: Yes     Alcohol/week: 2.0 standard drinks of alcohol    Types: 2 Glasses of wine per week    Comment: Socially   Drug use: No   Sexual activity: Not on file  Other Topics Concern   Not on file  Social History Narrative   Household-- pt, boyfriend  and  Child 2003   divorced   Social Determinants of Health   Financial Resource Strain: Not on file  Food Insecurity: Not on file  Transportation Needs: Not on file  Physical Activity: Not on file  Stress: Not on file  Social Connections: Unknown (03/28/2022)   Received from [facility], [facility]   Social Network    Social Network: Not on file  Intimate Partner Violence: Unknown (02/24/2022)   Received from [facility], [facility]   HITS    Physically Hurt: Not on file    Insult or Talk Down To: Not on file    Threaten Physical Harm: Not on file    Scream or Curse: Not on file    Physical Exam: Vital signs in last 24 hours: @BP  129/73   Pulse 83   Temp (!) 97.1 F (36.2 C)   Resp 12   Ht 5\' 6"  (1.676 m)   Wt 174 lb (78.9 kg)   SpO2 100%   BMI 28.08 kg/m  GEN: NAD EYE: Sclerae anicteric ENT: MMM CV: Non-tachycardic Pulm: CTA b/l GI: Soft, NT/ND NEURO:  Alert & Oriented x 3   Erick Blinks, MD [facility]  06/28/2023 8:45  AM

## 2023-06-28 NOTE — Progress Notes (Signed)
score specifically Cardiology Office Note:  .   Date:  06/28/2023  ID:  [name], DOB 1966/08/07, MRN [id number] PCP: Pearline Cables, MD  [facility] Providers Cardiologist: Sabino Denning  Click to update primary MD,subspecialty MD or APP then REFRESH:1}   History of Present Illness: .   Misty [name] is a 57 y.o. female  with hx of HLD, fam hx of HTN  + family hx of CAD Her primary medical doctor ordered a coronary calcium score. Coronary calcium score 614 which places her at the 99th percentile for age and sex matched controls.  Has been on Atorvastatin for > 5 years  Her last LDL was 77  Tries to eat a good diet   No CP with exercise  No regular exercise  Walks on occasion Admitting  Had left knee replacement 2 years ago ( Dr. 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 Atrial fib, had ablation  Mother had coronary stents in her 54s  She has tried high dose statins and does not tolerate them     ROS:   Studies Reviewed: Marland Kitchen        EKG Interpretation Date/Time:  Friday June 29 2023 10:34:19 EDT Ventricular Rate:  96 PR Interval:  166 QRS Duration:  78 QT Interval:  352 QTC Calculation: 444 R Axis:   70  Text Interpretation: Normal sinus rhythm Normal ECG When compared with ECG of 30-Jul-2014 18:28, No significant change was found Confirmed by Kristeen Miss ([id number]) on 06/29/2023 10:53:22 AM   Risk Assessment/Calculations:             Physical Exam:   VS:  There were no vitals taken for this visit.   Wt Readings from Last 3 Encounters:  06/28/23 174 lb (78.9 kg)  06/01/23 174 lb (78.9 kg)  04/11/23 182 lb 12.8 oz (82.9 kg)    GEN: Well nourished, well developed in no acute distress NECK: No JVD; No carotid bruits CARDIAC: RRR, no murmurs, rubs, gallops RESPIRATORY:  Clear to auscultation without rales, wheezing or  rhonchi  ABDOMEN: Soft, non-tender, non-distended EXTREMITIES:  No edema; No deformity   ASSESSMENT AND PLAN: .    1.  Hyperlipidemia: [name] presents for further evaluation of coronary artery calcifications and hyperlipidemia.  She has been tried on high-dose statins in the past and does not tolerate them.  She has severe muscle aches.  She is only on atorvastatin 10 mg a day.  I like to draw a lipoprotein a today.  We will refer her to the lipid clinic to consider her for a PCSK9 inhibitor or inclisiran.  I have asked her to start a walking program.  As of now she is not having any episodes of angina.  She may have angina as she increases her exercise.  I would have a low threshold to refer her for stress testing or coronary CT angiogram if she starts having pain.       Dispo: several months    Signed, Kristeen Miss, MD

## 2023-06-28 NOTE — Progress Notes (Signed)
Pt's states no medical or surgical changes since previsit or office visit. 

## 2023-06-28 NOTE — Patient Instructions (Signed)
Handout on polyps and diverticulosis given to patient Await pathology results Resume previous diet and continue present medications - metamucil or miralax can be tried over the counter per bottle instruction for constipation  Repeat colonoscopy for surveillance will be determined based off of pathology results   YOU HAD AN ENDOSCOPIC PROCEDURE TODAY AT THE [facility]:   Refer to the procedure report that was given to you for any specific questions about what was found during the examination.  If the procedure report does not answer your questions, please call your gastroenterologist to clarify.  If you requested that your care partner not be given the details of your procedure findings, then the procedure report has been included in a sealed envelope for you to review at your convenience later.  YOU SHOULD EXPECT: Some feelings of bloating in the abdomen. Passage of more gas than usual.  Walking can help get rid of the air that was put into your GI tract during the procedure and reduce the bloating. If you had a lower endoscopy (such as a colonoscopy or flexible sigmoidoscopy) you may notice spotting of blood in your stool or on the toilet paper. If you underwent a bowel prep for your procedure, you may not have a normal bowel movement for a few days.  Please Note:  You might notice some irritation and congestion in your nose or some drainage.  This is from the oxygen used during your procedure.  There is no need for concern and it should clear up in a day or so.  SYMPTOMS TO REPORT IMMEDIATELY:  Following lower endoscopy (colonoscopy or flexible sigmoidoscopy):  Excessive amounts of blood in the stool  Significant tenderness or worsening of abdominal pains  Swelling of the abdomen that is new, acute  Fever of 100F or higher  For urgent or emergent issues, a gastroenterologist can be reached at any hour by calling [phone]. Do not use MyChart messaging for urgent concerns.     DIET:  We do recommend a small meal at first, but then you may proceed to your regular diet.  Drink plenty of fluids but you should avoid alcoholic beverages for 24 hours.  ACTIVITY:  You should plan to take it easy for the rest of today and you should NOT DRIVE or use heavy machinery until tomorrow (because of the sedation medicines used during the test).    FOLLOW UP: Our staff will call the number listed on your records the next business day following your procedure.  We will call around 7:15- 8:00 am to check on you and address any questions or concerns that you may have regarding the information given to you following your procedure. If we do not reach you, we will leave a message.     If any biopsies were taken you will be contacted by phone or by letter within the next 1-3 weeks.  Please call us at [phone] if you have not heard about the biopsies in 3 weeks.    SIGNATURES/CONFIDENTIALITY: You and/or your care partner have signed paperwork which will be entered into your electronic medical record.  These signatures attest to the fact that that the information above on your After Visit Summary has been reviewed and is understood.  Full responsibility of the confidentiality of this discharge information lies with you and/or your care-partner.

## 2023-06-29 ENCOUNTER — Ambulatory Visit: Payer: 59 | Attending: Cardiovascular Disease | Admitting: Cardiovascular Disease

## 2023-06-29 ENCOUNTER — Telehealth: Payer: Self-pay | Admitting: *Deleted

## 2023-06-29 ENCOUNTER — Encounter: Payer: Self-pay | Admitting: Cardiovascular Disease

## 2023-06-29 VITALS — BP 112/76 | HR 95 | Ht 66.0 in | Wt 177.4 lb

## 2023-06-29 DIAGNOSIS — Z789 Other specified health status: Secondary | ICD-10-CM

## 2023-06-29 DIAGNOSIS — R931 Abnormal findings on diagnostic imaging of heart and coronary circulation: Secondary | ICD-10-CM | POA: Diagnosis not present

## 2023-06-29 DIAGNOSIS — E782 Mixed hyperlipidemia: Secondary | ICD-10-CM

## 2023-06-29 NOTE — Telephone Encounter (Signed)
  Follow up Call-     06/28/2023    8:29 AM 03/30/2021    2:49 PM  Call back number  Post procedure Call Back phone  # [phone]  Permission to leave phone message Yes Yes     Patient questions:  Do you have a fever, pain , or abdominal swelling? No. Pain Score  0 *  Have you tolerated food without any problems? Yes.    Have you been able to return to your normal activities? Yes.    Do you have any questions about your discharge instructions: Diet   No. Medications  No. Follow up visit  No.  Do you have questions or concerns about your Care? No.  Actions: * If pain score is 4 or above: No action needed, pain <4.

## 2023-06-29 NOTE — Patient Instructions (Signed)
Medication Instructions:  Your physician recommends that you continue on your current medications as directed. Please refer to the Current Medication list given to you today. *If you need a refill on your cardiac medications before your next appointment, please call your pharmacy*   Lab Work: Lipoprotein(a) today If you have labs (blood work) drawn today and your tests are completely normal, you will receive your results only by: MyChart Message (if you have MyChart) OR A paper copy in the mail If you have any lab test that is abnormal or we need to change your treatment, we will call you to review the results.   Testing/Procedures: Ambulatory referral to Lipid clinic to discuss PCSK9i    Follow-Up: At [facility], you and your health needs are our priority.  As part of our continuing mission to provide you with exceptional heart care, we have created designated Provider Care Teams.  These Care Teams include your primary Cardiologist (physician) and Advanced Practice Providers (APPs -  Physician Assistants and Nurse Practitioners) who all work together to provide you with the care you need, when you need it.  Your next appointment:   3 month(s)  Provider:   Kristeen Miss, MD

## 2023-06-30 LAB — LIPOPROTEIN A (LPA): Lipoprotein (a): 73.5 nmol/L

## 2023-07-02 ENCOUNTER — Encounter: Payer: Self-pay | Admitting: Internal Medicine

## 2023-07-02 ENCOUNTER — Telehealth: Payer: Self-pay

## 2023-07-02 DIAGNOSIS — R931 Abnormal findings on diagnostic imaging of heart and coronary circulation: Secondary | ICD-10-CM

## 2023-07-02 DIAGNOSIS — E782 Mixed hyperlipidemia: Secondary | ICD-10-CM

## 2023-07-02 DIAGNOSIS — Z789 Other specified health status: Secondary | ICD-10-CM

## 2023-07-02 NOTE — Telephone Encounter (Signed)
Pt has viewed physician's interpretation and recommendations via MyChart. Lipid clinic referral placed at this time.

## 2023-07-02 NOTE — Telephone Encounter (Signed)
-----   Message from Kristeen Miss sent at 07/01/2023  7:43 AM EDT ----- LP(a) is normal ( upper end of normal ) CAC score is 614 which is 99th percentile for age / sex matched controls She only tolerates low dose atorvastatin Please refer to lipid clinic for consideration for PCSK9 inhibitor

## 2023-07-12 NOTE — Progress Notes (Unsigned)
Patient ID: Misty [name]                 DOB: [birth year]                    MRN: [id number]     HPI: Misty [name] is a 57 y.o. female patient referred to lipid clinic by Dr. Elease [name]. PMH is significant for asthma, HLD, depression. May 2024 coronary calcium score 614 (99th percentile for age and sex matched controls) which was ordered by PCP. Referred to cardiology based on results. Had visit with Dr. Elease [name] on 06/29/23. Reported severe muscle aches with higher statin doses, had been on atorvastatin 10 mg daily for many years. Encouraged pt to start a walking for exercise. Lp(a) was normal. Referred to pharmacy for lipid management.  Today patient reports to clinic for lipid management. Reports having muscle aches with atorvastatin 10 mg daily, although she has been taking it for years and is unsure if aches are related to medication or just aging. She reports severe muscle aches with rosuvastatin 20 mg in the past. Was able to tolerate once weekly dosing of rosuvastatin, but then was switching to atorvastatin 10 mg daily for better efficacy. Notes her brother and sister take Repatha and tolerate it well. Interested in starting injectable medication. Does not exercise, but wants to start walking. Tries to eat a heart healthy diet and avoid processed foods. Mostly eats at home, no fast food.  Current Medications: atorvastatin 10 mg daily Intolerances: rosuvastatin 20 mg (muscle aches) Risk Factors: family hx of premature ASCVD, HLD, coronary Ca score 614 (99th percentile for age and sex), MESA 10 yr risk 7.5%, PREVENT 10 yr risk 2.5% LDL goal: <55 mg/dl  Diet:  Protein with every meal 2 fruits per day Veggies every day Cut out dairy one year ago Mostly eats at home No fast food  Exercise: no physical activity  Family History: Mother- COPD, heart disease (stent at 43, ablation); father- heart attack at 64, HTN, mesothelioma; Sister- HLD, HTN, rheumatoid arthritis; Sister- celiac, diabetes,  HLD, HTN, fibromyalgia; sister- COPD, diabetes, HTN; Brother- heart attack at 40, HLD, HTN  Social History: no tobacco, 2 glasses of wine per week  Labs: 04/11/23: TC 156, TG 49, HDL 69, LDL 77 2010: LDL 105  Past Medical History:  Diagnosis Date   Acne    from IUD/hormonal replacement   Allergy    seasonal   Anxiety    Anxiety and depression    Asthma    Depression    Diverticulosis    Esophageal ulcer    by EGD 08-03-2014   GERD (gastroesophageal reflux disease)    Hiatal hernia    Hypertension    IUD (intrauterine device) in place    Lactose intolerance    Nonspecific abnormal results of thyroid function study    Osteoarthritis    Dr.Collins    Other and unspecified hyperlipidemia    Sessile colonic polyp    Thyroid disease     Current Outpatient Medications on File Prior to Visit  Medication Sig Dispense Refill   albuterol (VENTOLIN HFA) 108 (90 Base) MCG/ACT inhaler Inhale into the lungs every 6 (six) hours as needed for wheezing or shortness of breath.     aspirin 81 MG tablet Take 81 mg by mouth daily. Reported on 02/23/2016     atorvastatin (LIPITOR) 10 MG tablet Take 1 tablet (10 mg total) by mouth daily. 90 tablet 3   Azelaic Acid 15 %  gel Apply 1 application topically 2 (two) times daily.     Cholecalciferol (VITAMIN D3) 2000 units capsule Take 2,000 Units by mouth daily.     cyanocobalamin (VITAMIN B12) 500 MCG tablet Take 500 mcg by mouth daily.     ESTRADIOL PO Place 1 patch onto the skin 2 (two) times a week.     Fluticasone Furoate-Vilanterol (BREO ELLIPTA) 50-25 MCG/ACT AEPB INHALE 1 PUFF INTO THE LUNGS EVERY DAY FOR 30 DAYS     loratadine (CLARITIN) 10 MG tablet Take 10 mg by mouth as needed.      losartan (COZAAR) 50 MG tablet Take 1 tablet (50 mg total) by mouth 2 (two) times daily. 180 tablet 3   MAGNESIUM PO Take by mouth daily.     Misc Natural Products (ADRENAL PO) Take 1 tablet by mouth daily.      montelukast (SINGULAIR) 10 MG tablet TAKE 1 TABLET  BY MOUTH EVERYDAY AT BEDTIME 90 tablet 3   Multiple Vitamin (MULTIVITAMIN) tablet Take 1 tablet by mouth daily.     NON FORMULARY retna cream for face     Omega-3 Fatty Acids (FISH OIL PO) Take 1 tablet by mouth daily.      OVER THE COUNTER MEDICATION Pro-biotic 1 capsule daily.     pantoprazole (PROTONIX) 40 MG tablet TAKE 1 TABLET BY MOUTH EVERY DAY 90 tablet 0   progesterone (PROMETRIUM) 200 MG capsule Take 200 mg by mouth at bedtime.     tretinoin (RETIN-A) 0.025 % cream Apply 1 application topically at bedtime.     valACYclovir (VALTREX) 500 MG tablet Take 500 mg by mouth daily.     venlafaxine XR (EFFEXOR-XR) 150 MG 24 hr capsule Take 1 capsule (150 mg total) by mouth daily with breakfast. 90 capsule 3   No current facility-administered medications on file prior to visit.    Allergies  Allergen Reactions   Lactose Intolerance (Gi) Other (See Comments)    GI side effects   Latex     Makes face itchy   Penicillins     Unspecified reaction as an infant    Assessment/Plan:  1. Hyperlipidemia - Uncontrolled based on LDL 77, above goal <55 mg/dl. Targeting more aggressive goal given highest LDL since 2010 was 105 with an elevated coronary Ca score 614 (99th percentile for age and sex), and family hx of premature ASCVD. MESA 10 yr risk score also higher at 7.5%. Discussed trial of reduced dose of rosuvastatin 5 mg daily vs. Praluent. Discussed mechanisms of action, dosing, side effects and potential decreases in LDL cholesterol. She has a latex allergy so will avoid Repatha. 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 Discussed adding Zetia if insurance does not approve Praluent. Encouraged her to continue heart healthy diet and incorporate walking into her schedule with ultimate goal of 30 minutes for 5 days per week. Will follow up with patient when we have updates regarding Praluent prior authorization.   Misty [name], PharmD PGY-1 Pharmacy Resident  Misty [name], PharmD, BCACP, CPP [name] [address], [place], [place] [address] Phone: [phone]; Fax: [phone] 07/13/2023 4:55 PM

## 2023-07-13 ENCOUNTER — Ambulatory Visit: Payer: 59 | Attending: Interventional Cardiology | Admitting: Pharmacist

## 2023-07-13 DIAGNOSIS — R931 Abnormal findings on diagnostic imaging of heart and coronary circulation: Secondary | ICD-10-CM | POA: Insufficient documentation

## 2023-07-13 MED ORDER — ROSUVASTATIN CALCIUM 5 MG PO TABS: 5.0000 mg | ORAL_TABLET | Freq: Every day | ORAL | 11 refills | Status: DC

## 2023-07-13 NOTE — Patient Instructions (Addendum)
Stop taking atorvastatin and start taking rosuvastatin 5 mg daily.  We will work on Patent attorney and give you a call next week when we hear back from your insurance.

## 2023-07-16 ENCOUNTER — Other Ambulatory Visit (HOSPITAL_COMMUNITY): Payer: Self-pay

## 2023-07-16 ENCOUNTER — Telehealth: Payer: Self-pay

## 2023-07-16 ENCOUNTER — Telehealth: Payer: Self-pay | Admitting: Pharmacist

## 2023-07-16 ENCOUNTER — Other Ambulatory Visit: Payer: Self-pay | Admitting: Cardiovascular Disease

## 2023-07-16 DIAGNOSIS — R931 Abnormal findings on diagnostic imaging of heart and coronary circulation: Secondary | ICD-10-CM

## 2023-07-16 MED ORDER — PRALUENT 75 MG/ML ~~LOC~~ SOAJ: 75.0000 mg | SUBCUTANEOUS | 11 refills | Status: DC

## 2023-07-16 NOTE — Telephone Encounter (Signed)
Pt has Latex allergy, needs Praluent. I already submitted a PA for Praluent and received a message back that no PA is needed. Unclear why we are getting this message from her pharmacy. Asking tech to run test claim.

## 2023-07-16 NOTE — Telephone Encounter (Signed)
Praluent prior auth submitted, received message back that med is available on formulary. Rx sent to pharmacy, called pt and advised her to let us know if copay is > $50 and can set her up with copay card. She prefers to start this instead of lower dose of rosuvastatin (had not picked up from the pharmacy yet). Will recheck labs in October, she will call with any concerns before then.

## 2023-07-16 NOTE — Telephone Encounter (Addendum)
Tech has called [employer], they have faxed over [employer] PA form which has been submitted by Dorathy Daft, pt aware.

## 2023-07-16 NOTE — Telephone Encounter (Signed)
Received message from pharmacy that Praluent isn't covered. Unclear how this is the case when I tried submitting a PA and was told one wasn't even required and the med was available on her formulary. Tech has called Financial [employer], they are sending over PA form. Pt aware.

## 2023-07-16 NOTE — Telephone Encounter (Signed)
PA request filled out and faxed to plan.   Status is pending

## 2023-07-16 NOTE — Telephone Encounter (Signed)
Pharmacy Patient Advocate Encounter   Received notification from Physician's Office that prior authorization for PRALUENT is required/requested.   Insurance verification completed.   The patient is insured through [employer] .   Per test claim: PA required; PA was started via CoverMyMeds and [id number] gave error and would not allow for electronic submission. Contacted plan via phone to request PA form via fax, waiting on form now.

## 2023-07-17 DIAGNOSIS — J301 Allergic rhinitis due to pollen: Secondary | ICD-10-CM | POA: Diagnosis not present

## 2023-07-17 DIAGNOSIS — J3081 Allergic rhinitis due to animal (cat) (dog) hair and dander: Secondary | ICD-10-CM | POA: Diagnosis not present

## 2023-07-17 DIAGNOSIS — J3089 Other allergic rhinitis: Secondary | ICD-10-CM | POA: Diagnosis not present

## 2023-07-17 DIAGNOSIS — J4530 Mild persistent asthma, uncomplicated: Secondary | ICD-10-CM | POA: Diagnosis not present

## 2023-07-18 ENCOUNTER — Other Ambulatory Visit (HOSPITAL_COMMUNITY): Payer: Self-pay

## 2023-07-20 ENCOUNTER — Other Ambulatory Visit (HOSPITAL_COMMUNITY): Payer: Self-pay

## 2023-07-20 MED ORDER — PRALUENT 75 MG/ML ~~LOC~~ SOAJ: 75.0000 mg | SUBCUTANEOUS | 11 refills | Status: DC

## 2023-07-20 NOTE — Telephone Encounter (Signed)
Praluent PA approved, pt notified.

## 2023-07-20 NOTE — Telephone Encounter (Signed)
Pt notified of Praluent PA approval.

## 2023-07-20 NOTE — Telephone Encounter (Signed)
Pharmacy Patient Advocate Encounter  Received notification from [employer] that Prior Authorization for PRALUENT has been APPROVED from 07/19/23 to 07/18/24. Ran test claim, Copay is $55. This test claim was processed through [facility]- copay amounts may vary at other pharmacies due to pharmacy/plan contracts, or as the patient moves through the different stages of their insurance plan.

## 2023-09-19 ENCOUNTER — Ambulatory Visit: Payer: 59

## 2023-09-25 ENCOUNTER — Ambulatory Visit: Payer: 59

## 2023-09-27 DIAGNOSIS — M1712 Unilateral primary osteoarthritis, left knee: Secondary | ICD-10-CM | POA: Diagnosis not present

## 2023-10-02 ENCOUNTER — Ambulatory Visit: Payer: 59 | Admitting: Cardiovascular Disease

## 2023-10-25 DIAGNOSIS — L718 Other rosacea: Secondary | ICD-10-CM | POA: Diagnosis not present

## 2023-11-16 DIAGNOSIS — Z124 Encounter for screening for malignant neoplasm of cervix: Secondary | ICD-10-CM | POA: Diagnosis not present

## 2023-11-16 DIAGNOSIS — N879 Dysplasia of cervix uteri, unspecified: Secondary | ICD-10-CM | POA: Diagnosis not present

## 2023-11-27 ENCOUNTER — Other Ambulatory Visit: Payer: 59

## 2023-11-27 DIAGNOSIS — R931 Abnormal findings on diagnostic imaging of heart and coronary circulation: Secondary | ICD-10-CM

## 2023-11-29 DIAGNOSIS — R931 Abnormal findings on diagnostic imaging of heart and coronary circulation: Secondary | ICD-10-CM | POA: Diagnosis not present

## 2023-11-29 LAB — HEPATIC FUNCTION PANEL
ALT: 19 [IU]/L (ref 0–32)
AST: 14 [IU]/L (ref 0–40)
Albumin: 4.2 g/dL (ref 3.8–4.9)
Alkaline Phosphatase: 75 [IU]/L (ref 44–121)
Bilirubin Total: 0.6 mg/dL (ref 0.0–1.2)
Bilirubin, Direct: 0.2 mg/dL (ref 0.00–0.40)
Total Protein: 6.3 g/dL (ref 6.0–8.5)

## 2023-11-29 LAB — LIPID PANEL
Chol/HDL Ratio: 2.2 {ratio} (ref 0.0–4.4)
Cholesterol, Total: 196 mg/dL (ref 100–199)
HDL: 91 mg/dL (ref >39)
LDL Chol Calc (NIH): 94 mg/dL (ref 0–99)
Triglycerides: 61 mg/dL (ref 0–149)
VLDL Cholesterol Cal: 11 mg/dL (ref 5–40)

## 2023-11-30 ENCOUNTER — Telehealth: Payer: Self-pay | Admitting: Pharmacy Technician

## 2023-11-30 ENCOUNTER — Other Ambulatory Visit (HOSPITAL_COMMUNITY): Payer: Self-pay

## 2023-11-30 MED ORDER — REPATHA SURECLICK 140 MG/ML ~~LOC~~ SOAJ: 1.0000 mL | SUBCUTANEOUS | 11 refills | Status: DC

## 2023-11-30 NOTE — Telephone Encounter (Signed)
-----   Message from Eleanor JONETTA Crews sent at 11/30/2023  1:27 PM EST ----- Regarding: FW: Praluent  rx Yes, we will go ahead and get the PA done for Repatha . Thanks ----- Message ----- From: Verta Lauraine POUR, RN Sent: 11/30/2023  12:00 PM EST To: [facility] Pharm[facility] Subject: Praluent  rx                                    Called pt regarding recent blood work results. She's on praluent  and has one dose left to take (will use this on Monday) and is scheduled to see Dr Alveta on Tuesday. She states her insurance has changed and Repatha  is the new preferred. Can we go ahead and switch her?

## 2023-11-30 NOTE — Telephone Encounter (Signed)
 Pharmacy Patient Advocate Encounter   Received notification from Physician's Office that prior authorization for repatha  is required/requested.   Insurance verification completed.   The patient is insured through  [employer] prescrip  .   Per test claim: The current 11/30/23 day co-pay is, $35.00 one month.  No PA needed at this time. This test claim was processed through [facility]- copay amounts may vary at other pharmacies due to pharmacy/plan contracts, or as the patient moves through the different stages of their insurance plan.

## 2023-11-30 NOTE — Addendum Note (Signed)
 Addended by: Malena Peer D on: 11/30/2023 05:04 PM   Modules accepted: Orders

## 2023-12-01 ENCOUNTER — Encounter: Payer: Self-pay | Admitting: Cardiovascular Disease

## 2023-12-01 NOTE — Progress Notes (Signed)
 score specifically Cardiology Office Note:  .   Date:  12/04/2023  ID:  Misty [name], DOB 1966/07/21, MRN [id number] PCP: Watt Harlene BROCKS, MD  [facility] Providers Cardiologist: Jerelle Virden  Click to update primary MD,subspecialty MD or APP then REFRESH:1}   History of Present Illness: .   Misty [name] is a 58 y.o. female  with hx of HLD, fam hx of HTN  + family hx of CAD Her primary medical doctor ordered a coronary calcium  score. Coronary calcium  score 614 which places her at the 99th percentile for age and sex matched controls.  Has been on Atorvastatin  for > 5 years  Her last LDL was 77  Tries to eat a good diet   No CP with exercise  No regular exercise  Walks on occasion Admitting  Had left knee replacement 2 years ago ( 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 Atrial fib, had ablation  Mother had coronary stents in her 18s  She has tried high dose statins and does not tolerate them   Jan. 14, 2025  Misty [name] is seen for follow up of her HTN, HLD , coronary artery calcification    No CP , no dyspnea .  Is not exercising ,  we discussed getting back to th gym Her last LDL ( while on Praluent  was 94)  She is going to start Repatha  .   Will get follow up labs in 3 months    ROS:   Studies Reviewed: .            Risk Assessment/Calculations:     Physical Exam:     Physical Exam: Blood pressure 134/86, pulse 88, height 5' 6 (1.676 m), weight 204 lb 6.4 oz (92.7 kg), SpO2 94%.       GEN:  moderately obese , middle age female,  in no acute distress HEENT: Normal NECK: No JVD; No carotid bruits LYMPHATICS: No lymphadenopathy CARDIAC: RRR , no murmurs, rubs, gallops RESPIRATORY:  Clear to auscultation without rales, wheezing or rhonchi  ABDOMEN: Soft, non-tender, non-distended MUSCULOSKELETAL:  No edema;  No deformity  SKIN: Warm and dry NEUROLOGIC:  Alert and oriented x 3   ASSESSMENT AND PLAN: .    1.  Hyperlipidemia:   she is changing from Pralulent to [employer] ( insurance reasons)   .  We will check labs in 3 months   Will have her follow up with Dr. Mona in a year     Dispo:  1 year     Signed, Aleene Passe, MD

## 2023-12-04 ENCOUNTER — Ambulatory Visit: Payer: 59 | Attending: Cardiovascular Disease | Admitting: Cardiovascular Disease

## 2023-12-04 ENCOUNTER — Encounter: Payer: Self-pay | Admitting: Cardiovascular Disease

## 2023-12-04 VITALS — BP 134/86 | HR 88 | Ht 66.0 in | Wt 204.4 lb

## 2023-12-04 DIAGNOSIS — E782 Mixed hyperlipidemia: Secondary | ICD-10-CM | POA: Diagnosis not present

## 2023-12-04 DIAGNOSIS — Z789 Other specified health status: Secondary | ICD-10-CM | POA: Diagnosis not present

## 2023-12-04 NOTE — Patient Instructions (Signed)
 Lab Work: Lipids, ALT, BMET in 3 months If you have labs (blood work) drawn today and your tests are completely normal, you will receive your results only by: MyChart Message (if you have MyChart) OR A paper copy in the mail If you have any lab test that is abnormal or we need to change your treatment, we will call you to review the results.  Follow-Up: At [facility], you and your health needs are our priority.  As part of our continuing mission to provide you with exceptional heart care, we have created designated Provider Care Teams.  These Care Teams include your primary Cardiologist (physician) and Advanced Practice Providers (APPs -  Physician Assistants and Nurse Practitioners) who all work together to provide you with the care you need, when you need it.  Your next appointment:   1 year(s)  Provider:   Vinie Maxcy, MD     1st Floor: - Lobby - Registration  - Pharmacy  - Lab - Cafe  2nd Floor: - PV Lab - Diagnostic Testing (echo, CT, nuclear med)  3rd Floor: - Vacant  4th Floor: - TCTS (cardiothoracic surgery) - AFib Clinic - Structural Heart Clinic - Vascular Surgery  - Vascular Ultrasound  5th Floor: - [facility] Cardiology (general and EP) - Clinical Pharmacy for coumadin, hypertension, lipid, weight-loss medications, and med management appointments    Valet parking services will be available as well.

## 2024-02-06 DIAGNOSIS — M1712 Unilateral primary osteoarthritis, left knee: Secondary | ICD-10-CM | POA: Diagnosis not present

## 2024-02-19 DIAGNOSIS — L718 Other rosacea: Secondary | ICD-10-CM | POA: Diagnosis not present

## 2024-02-20 DIAGNOSIS — L718 Other rosacea: Secondary | ICD-10-CM | POA: Diagnosis not present

## 2024-02-20 DIAGNOSIS — G514 Facial myokymia: Secondary | ICD-10-CM | POA: Diagnosis not present

## 2024-05-24 ENCOUNTER — Other Ambulatory Visit: Payer: Self-pay | Admitting: Family Medicine

## 2024-05-24 DIAGNOSIS — I10 Essential (primary) hypertension: Secondary | ICD-10-CM

## 2024-05-26 ENCOUNTER — Encounter: Payer: Self-pay | Admitting: *Deleted

## 2024-06-09 ENCOUNTER — Other Ambulatory Visit: Payer: Self-pay | Admitting: Family Medicine

## 2024-06-09 DIAGNOSIS — F325 Major depressive disorder, single episode, in full remission: Secondary | ICD-10-CM

## 2024-06-09 DIAGNOSIS — I10 Essential (primary) hypertension: Secondary | ICD-10-CM

## 2024-06-09 MED ORDER — LOSARTAN POTASSIUM 50 MG PO TABS
50.0000 mg | ORAL_TABLET | Freq: Two times a day (BID) | ORAL | 0 refills | Status: DC
Start: 2024-06-09 — End: 2024-07-30

## 2024-06-09 MED ORDER — VENLAFAXINE HCL ER 150 MG PO CP24
150.0000 mg | ORAL_CAPSULE | Freq: Every day | ORAL | 0 refills | Status: DC
Start: 2024-06-09 — End: 2024-07-30

## 2024-06-09 NOTE — Telephone Encounter (Signed)
 Copied from CRM [id number]. Topic: Clinical - Medication Refill >> Jun 09, 2024 11:51 AM [name] wrote: Medication:  losartan  (COZAAR ) 50 MG tablet venlafaxine  XR (EFFEXOR -XR) 150 MG 24 hr capsule Medication  Has the patient contacted their pharmacy? Yes (Agent: If no, request that the patient contact the pharmacy for the refill. If patient does not wish to contact the pharmacy document the reason why and proceed with request.) (Agent: If yes, when and what did the pharmacy advise?) Pharmacy does not have refills available.  This is the patient's preferred pharmacy:  [id number] [address], [place] - [address] [place] [address] Phone: [phone] Fax: [phone]  Is this the correct pharmacy for this prescription? Yes If no, delete pharmacy and type the correct one.   Has the prescription been filled recently? No  Is the patient out of the medication? No  Has the patient been seen for an appointment in the last year OR does the patient have an upcoming appointment? Yes  Can we respond through MyChart? Yes  Agent: Please be advised that Rx refills may take up to 3 business days. We ask that you follow-up with your pharmacy.

## 2024-06-09 NOTE — Telephone Encounter (Signed)
 FYI Only or Action Required?: Action required by provider: medication refill request.  Patient was last seen in primary care on 04/11/2023 by Copland, Harlene BROCKS, MD.  Called Nurse Triage reporting No chief complaint on file..  Symptoms began today.  Interventions attempted: Nothing.  Symptoms are: stable.  Triage Disposition: No disposition on file.  Patient/caregiver understands and will follow disposition?:

## 2024-06-20 ENCOUNTER — Other Ambulatory Visit: Payer: Self-pay | Admitting: Family Medicine

## 2024-06-20 DIAGNOSIS — F325 Major depressive disorder, single episode, in full remission: Secondary | ICD-10-CM

## 2024-06-20 DIAGNOSIS — Z9109 Other allergy status, other than to drugs and biological substances: Secondary | ICD-10-CM

## 2024-06-25 ENCOUNTER — Encounter: Admitting: Family Medicine

## 2024-07-04 DIAGNOSIS — J4530 Mild persistent asthma, uncomplicated: Secondary | ICD-10-CM | POA: Diagnosis not present

## 2024-07-04 DIAGNOSIS — J3081 Allergic rhinitis due to animal (cat) (dog) hair and dander: Secondary | ICD-10-CM | POA: Diagnosis not present

## 2024-07-04 DIAGNOSIS — J45909 Unspecified asthma, uncomplicated: Secondary | ICD-10-CM | POA: Diagnosis not present

## 2024-07-04 DIAGNOSIS — J3089 Other allergic rhinitis: Secondary | ICD-10-CM | POA: Diagnosis not present

## 2024-07-04 DIAGNOSIS — J301 Allergic rhinitis due to pollen: Secondary | ICD-10-CM | POA: Diagnosis not present

## 2024-07-04 DIAGNOSIS — J45998 Other asthma: Secondary | ICD-10-CM | POA: Diagnosis not present

## 2024-07-04 NOTE — Patient Instructions (Incomplete)
 Good to see you today- I will be in touch with your labs  Recommend flu vaccine this fall

## 2024-07-04 NOTE — Progress Notes (Deleted)
 [facility] [address] [place], [place] [address] [phone]  Date:  07/09/2024   Name:  Misty [name]   DOB:  02-Sep-1966   MRN:  [id number]  PCP:  Watt Harlene BROCKS, MD    Chief Complaint: No chief complaint on file.   History of Present Illness:  Misty [name] is a 58 y.o. very pleasant female patient who presents with the following:  Pt seen today for a CPE Last seen by me just over a year ago also for a CPE  History of hyperlipidemia, esophageal ulcer, asthma, OA/ polyarthralgia treated by rheumatology/ Dr Gerome at [facility], hypertension on losartan    Gyn Dr Curlene Pap Mammo 4/24 Colon 20204  Asa Estrogen patch/ progesterone   Repatha   Losartan  Singulair  Omega 3 Effexor  xr   Patient Active Problem List   Diagnosis Date Noted   Elevated coronary artery calcium  score 07/13/2023   Hashimoto's disease 01/29/2020   FH: CAD (coronary artery disease) 08/25/2014   Esophageal ulcer 08/03/2014   *Depression 05/12/2009   ECZEMA 05/12/2009   Osteoarthritis 05/12/2009   *Hyperlipidemia 03/26/2008   Premenstrual tension syndrome 03/26/2008   LACTOSE INTOLERANCE 03/24/2008   Asthma, f/u by her allergist  03/24/2008   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    Past Medical History:  Diagnosis Date   Acne    from IUD/hormonal replacement   Allergy    seasonal   Anxiety    Anxiety and depression    Asthma    Depression    Diverticulosis    Esophageal ulcer    by EGD 08-03-2014   GERD (gastroesophageal reflux disease)    Hiatal hernia    Hypertension    IUD (intrauterine device) in place    Lactose intolerance    Nonspecific abnormal results of thyroid  function study    Osteoarthritis    Dr.Collins    Other and unspecified hyperlipidemia    Sessile colonic polyp    Thyroid  disease     Past Surgical History:  Procedure Laterality Date   BILATERAL KNEE ARTHROSCOPY     Left: 11/10/2008, Right:  04/15/2009. Dr.Andy Collins   G 1 P 1     GANGLION CYST EXCISION     Right Wrist    LEEP  03/15/2018   right knee replacement 2021     SPIROMETRY  01/2016   TONSILLECTOMY AND ADENOIDECTOMY      Social History   Tobacco Use   Smoking status: Never   Smokeless tobacco: Never  Vaping Use   Vaping status: Never Used  Substance Use Topics   Alcohol use: Yes    Alcohol/week: 2.0 standard drinks of alcohol    Types: 2 Glasses of wine per week    Comment: Socially   Drug use: No    Family History  Problem Relation Age of Onset   COPD Mother        smoker   Heart disease Mother        stent @ 58; ablation for tachycardia   Heart attack Father 44   Lung disease Father        mesothelioma   Hypertension Father    Rheum arthritis Sister    Hyperlipidemia Sister    Hypertension Sister    Osteopenia Sister    Fibromyalgia Sister    Hyperlipidemia Sister    Celiac disease Sister    Diabetes Sister    Hypertension Sister    Diabetes Sister  COPD Sister    Hypertension Sister    Heart attack Brother 45   Hypertension Brother    Hyperlipidemia Brother    Diabetes Maternal Aunt    Lung cancer Maternal Grandmother    Stroke Maternal Grandfather    Breast cancer Paternal Grandmother    Eczema Daughter    Esophageal cancer Neg Hx    Stomach cancer Neg Hx    Colon cancer Neg Hx    Rectal cancer Neg Hx    Colon polyps Neg Hx     Allergies  Allergen Reactions   Lactose Intolerance (Gi) Other (See Comments)    GI side effects   Latex     Makes face itchy   Penicillins     Unspecified reaction as an infant   Statins     Muscle aches with atorvastatin  10-20mg  daily and rosuvastatin  20mg  daily    Medication list has been reviewed and updated.  Current Outpatient Medications on File Prior to Visit  Medication Sig Dispense Refill   albuterol (VENTOLIN HFA) 108 (90 Base) MCG/ACT inhaler Inhale into the lungs every 6 (six) hours as needed for wheezing or shortness of  breath.     aspirin 81 MG tablet Take 81 mg by mouth daily. Reported on 02/23/2016     Azelaic Acid 15 % gel Apply 1 application topically 2 (two) times daily.     BREO ELLIPTA 100-25 MCG/ACT AEPB Inhale 1 puff into the lungs daily.     Cholecalciferol (VITAMIN D3) 2000 units capsule Take 2,000 Units by mouth daily.     cyanocobalamin (VITAMIN B12) 500 MCG tablet Take 500 mcg by mouth daily.     estradiol  (VIVELLE -DOT) 0.05 MG/24HR patch Place 1 patch onto the skin 2 (two) times a week.     Evolocumab  (REPATHA  SURECLICK) 140 MG/ML SOAJ Inject 140 mg into the skin every 14 (fourteen) days. 2 mL 11   loratadine (CLARITIN) 10 MG tablet Take 10 mg by mouth as needed.      losartan  (COZAAR ) 50 MG tablet Take 1 tablet (50 mg total) by mouth 2 (two) times daily. 180 tablet 0   MAGNESIUM PO Take by mouth daily.     Misc Natural Products (ADRENAL PO) Take 1 tablet by mouth daily.      montelukast  (SINGULAIR ) 10 MG tablet TAKE 1 TABLET BY MOUTH EVERYDAY AT BEDTIME 90 tablet 3   Multiple Vitamin (MULTIVITAMIN) tablet Take 1 tablet by mouth daily.     NON FORMULARY retna cream for face     Omega-3 Fatty Acids (FISH OIL PO) Take 1 tablet by mouth daily.      OVER THE COUNTER MEDICATION Pro-biotic 1 capsule daily.     pantoprazole  (PROTONIX ) 40 MG tablet TAKE 1 TABLET BY MOUTH EVERY DAY 90 tablet 0   progesterone  (PROMETRIUM ) 200 MG capsule Take 200 mg by mouth at bedtime.     tretinoin (RETIN-A) 0.025 % cream Apply 1 application topically at bedtime.     valACYclovir (VALTREX) 500 MG tablet Take 500 mg by mouth daily.     venlafaxine  XR (EFFEXOR -XR) 150 MG 24 hr capsule Take 1 capsule (150 mg total) by mouth daily with breakfast. 90 capsule 0   No current facility-administered medications on file prior to visit.    Review of Systems:  As per HPI- otherwise negative.   Physical Examination: There were no vitals filed for this visit. There were no vitals filed for this visit. There is no height or  weight on  file to calculate BMI. Ideal Body Weight:    GEN: no acute distress. HEENT: Atraumatic, Normocephalic.  Ears and Nose: No external deformity. CV: RRR, No M/G/R. No JVD. No thrill. No extra heart sounds. PULM: CTA B, no wheezes, crackles, rhonchi. No retractions. No resp. distress. No accessory muscle use. ABD: S, NT, ND, +BS. No rebound. No HSM. EXTR: No c/c/e PSYCH: Normally interactive. Conversant.    Assessment and Plan: *** Physical exam today- encouraged healthy diet and exercise routine Will plan further follow- up pending labs.  Signed Harlene Schroeder, MD

## 2024-07-09 ENCOUNTER — Encounter: Admitting: Family Medicine

## 2024-07-09 DIAGNOSIS — Z13220 Encounter for screening for lipoid disorders: Secondary | ICD-10-CM

## 2024-07-09 DIAGNOSIS — Z0000 Encounter for general adult medical examination without abnormal findings: Secondary | ICD-10-CM

## 2024-07-09 DIAGNOSIS — Z1329 Encounter for screening for other suspected endocrine disorder: Secondary | ICD-10-CM

## 2024-07-09 DIAGNOSIS — Z130 Encounter for screening for diseases of the blood and blood-forming organs and certain disorders involving the immune mechanism: Secondary | ICD-10-CM

## 2024-07-09 DIAGNOSIS — Z131 Encounter for screening for diabetes mellitus: Secondary | ICD-10-CM

## 2024-07-29 NOTE — Progress Notes (Signed)
 [facility] [address], [address] [place], [place] [address] [phone]  Date:  07/30/2024   Name:  Misty [name]   DOB:  [birth year]   MRN:  [id number]  PCP:  Misty Harlene BROCKS, MD    Chief Complaint: No chief complaint on file.   History of Present Illness:  Misty [name] is a 58 y.o. very pleasant female patient who presents with the following:  Pt seen today for a CPE Last seen by me just over a year ago also for a CPE  History of hyperlipidemia, esophageal ulcer, asthma, OA/ polyarthralgia treated by rheumatology/ Dr Misty [name] at [facility], hypertension on losartan   She was seen by cardiology, Dr. Alveta in January due to elevated coronary calcium  score, 99th percentile.  She will be switching over to Dr. Mona as Dr. Alveta is retiring  Gyn Dr Misty [name] looks like her last visit was in December Pap- UTD per gynecology Mammo 4/24- scheduled for this fall  Colon 2024  Asa Estrogen patch/ progesterone   Repatha   Losartan  Singulair  Omega 3 Effexor  xr   Recommend flu shot- give today  She can also do her pneumonia booster if she would like Shingrix  is complete  Discussed the use of AI scribe software for clinical note transcription with the patient, who gave verbal consent to proceed.  History of Present Illness Misty [name] is a 58 year old female who presents for a routine follow-up and medication refills.  She recently recovered from COVID-19, which she contracted after being unable to receive a booster shot over the summer.  She has a history of hypertension and is currently taking losartan , initially started on 50 mg twice a day due to episodes of elevated blood pressure attributed to work-related stress. She does not experience dizziness unless she stands up suddenly.  I asked her to switch over to 100 mg once a day and she would like to do this  She is also taking venlafaxine  and pantoprazole . She  recently refilled her Singulair  and is requesting refills for her losartan , venlafaxine , and pantoprazole . We talked about a GLP-1.  Patient notes she tried Wegovy  before but was bothered by side effects as well as cost so she stopped using it.  She is frustrated by some weight gain, notes she tends to gain weight and eat more under stress  She has a daughter who recently celebrated her 22nd birthday. She attempted to travel over the summer but was limited due to her daughter's summer classes and her own illness.   Patient Active Problem List   Diagnosis Date Noted   Prediabetes 07/30/2024   Elevated coronary artery calcium  score 07/13/2023   Hashimoto's disease 01/29/2020   FH: CAD (coronary artery disease) 08/25/2014   Esophageal ulcer 08/03/2014   *Depression 05/12/2009   ECZEMA 05/12/2009   Osteoarthritis 05/12/2009   *Hyperlipidemia 03/26/2008   Premenstrual tension syndrome 03/26/2008   LACTOSE INTOLERANCE 03/24/2008   Asthma, f/u by her allergist  03/24/2008   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    Past Medical History:  Diagnosis Date   Acne    from IUD/hormonal replacement   Allergy    seasonal   Anxiety    Anxiety and depression    Asthma    Depression    Diverticulosis    Esophageal ulcer    by EGD 08-03-2014   GERD (gastroesophageal reflux disease)    Hiatal hernia    Hypertension    IUD (intrauterine  device) in place    Lactose intolerance    Nonspecific abnormal results of thyroid  function study    Osteoarthritis    Dr.Collins    Other and unspecified hyperlipidemia    Sessile colonic polyp    Thyroid  disease     Past Surgical History:  Procedure Laterality Date   BILATERAL KNEE ARTHROSCOPY     Left: 11/10/2008, Right: 04/15/2009. Dr.Andy Collins   G 1 P 1     GANGLION CYST EXCISION     Right Wrist    LEEP  03/15/2018   right knee replacement 2021     SPIROMETRY  01/2016   TONSILLECTOMY AND ADENOIDECTOMY      Social History   Tobacco Use   Smoking status:  Never   Smokeless tobacco: Never  Vaping Use   Vaping status: Never Used  Substance Use Topics   Alcohol use: Yes    Alcohol/week: 2.0 standard drinks of alcohol    Types: 2 Glasses of wine per week    Comment: Socially   Drug use: No    Family History  Problem Relation Age of Onset   COPD Mother        smoker   Heart disease Mother        stent @ 42; ablation for tachycardia   Heart attack Father 34   Lung disease Father        mesothelioma   Hypertension Father    Rheum arthritis Sister    Hyperlipidemia Sister    Hypertension Sister    Osteopenia Sister    Fibromyalgia Sister    Hyperlipidemia Sister    Celiac disease Sister    Diabetes Sister    Hypertension Sister    Diabetes Sister    COPD Sister    Hypertension Sister    Heart attack Brother 27   Hypertension Brother    Hyperlipidemia Brother    Diabetes Maternal Aunt    Lung cancer Maternal Grandmother    Stroke Maternal Grandfather    Breast cancer Paternal Grandmother    Eczema Daughter    Esophageal cancer Neg Hx    Stomach cancer Neg Hx    Colon cancer Neg Hx    Rectal cancer Neg Hx    Colon polyps Neg Hx     Allergies  Allergen Reactions   Lactose Intolerance (Gi) Other (See Comments)    GI side effects   Latex     Makes face itchy   Penicillins     Unspecified reaction as an infant   Statins     Muscle aches with atorvastatin  10-20mg  daily and rosuvastatin  20mg  daily    Medication list has been reviewed and updated.  Current Outpatient Medications on File Prior to Visit  Medication Sig Dispense Refill   albuterol (VENTOLIN HFA) 108 (90 Base) MCG/ACT inhaler Inhale into the lungs every 6 (six) hours as needed for wheezing or shortness of breath.     aspirin 81 MG tablet Take 81 mg by mouth daily. Reported on 02/23/2016     Azelaic Acid 15 % gel Apply 1 application topically 2 (two) times daily.     BREO ELLIPTA 100-25 MCG/ACT AEPB Inhale 1 puff into the lungs daily.     Cholecalciferol  (VITAMIN D3) 2000 units capsule Take 2,000 Units by mouth daily.     cyanocobalamin (VITAMIN B12) 500 MCG tablet Take 500 mcg by mouth daily.     estradiol  (VIVELLE -DOT) 0.05 MG/24HR patch Place 1 patch onto the skin 2 (  two) times a week.     Evolocumab  (REPATHA  SURECLICK) 140 MG/ML SOAJ Inject 140 mg into the skin every 14 (fourteen) days. 2 mL 11   loratadine (CLARITIN) 10 MG tablet Take 10 mg by mouth as needed.      MAGNESIUM PO Take by mouth daily.     Misc Natural Products (ADRENAL PO) Take 1 tablet by mouth daily.      montelukast  (SINGULAIR ) 10 MG tablet TAKE 1 TABLET BY MOUTH EVERYDAY AT BEDTIME 90 tablet 3   Multiple Vitamin (MULTIVITAMIN) tablet Take 1 tablet by mouth daily.     NON FORMULARY retna cream for face     Omega-3 Fatty Acids (FISH OIL PO) Take 1 tablet by mouth daily.      OVER THE COUNTER MEDICATION Pro-biotic 1 capsule daily.     progesterone  (PROMETRIUM ) 200 MG capsule Take 200 mg by mouth at bedtime.     tretinoin (RETIN-A) 0.025 % cream Apply 1 application topically at bedtime.     valACYclovir (VALTREX) 500 MG tablet Take 500 mg by mouth daily.     No current facility-administered medications on file prior to visit.    Review of Systems:  As per HPI- otherwise negative. BP Readings from Last 3 Encounters:  07/30/24 113/69  12/04/23 134/86  06/29/23 112/76     Physical Examination: Vitals:   07/30/24 0904  BP: 113/69  Pulse: 98   Vitals:   07/30/24 0904  Weight: 225 lb (102.1 kg)  Height: 5' 6 (1.676 m)   Body mass index is 36.32 kg/m. Ideal Body Weight: Weight in (lb) to have BMI = 25: 154.6  GEN: no acute distress.   Obese, looks well  HEENT: Atraumatic, Normocephalic.  TM wnl bilaterally, PEERL Ears and Nose: No external deformity. CV: RRR, No M/G/R. No JVD. No thrill. No extra heart sounds. PULM: CTA B, no wheezes, crackles, rhonchi. No retractions. No resp. distress. No accessory muscle use. ABD: S, NT, ND, +BS. No rebound. No  HSM. EXTR: No c/c/e PSYCH: Normally interactive. Conversant.    Assessment and Plan: Physical exam  Elevated coronary artery calcium  score - Plan: Lipid panel  Screening for thyroid  disorder - Plan: TSH  Hyperlipidemia, unspecified hyperlipidemia type - Plan: Lipid panel  Essential hypertension - Plan: CBC, Comprehensive metabolic panel with GFR, losartan  (COZAAR ) 100 MG tablet  Screening for diabetes mellitus - Plan: Hemoglobin A1c  Need for influenza vaccination - Plan: Flu vaccine trivalent PF, 6mos and older(Flulaval,Afluria,Fluarix,Fluzone)  Gastroesophageal reflux disease, unspecified whether esophagitis present - Plan: pantoprazole  (PROTONIX ) 40 MG tablet  Depression, major, single episode, complete remission (HCC) - Plan: venlafaxine  XR (EFFEXOR -XR) 150 MG 24 hr capsule  Prediabetes  Physical exam today- encouraged healthy diet and exercise routine Will plan further follow- up pending labs. Flu shot given today Venlafaxine  is doing a good job with her depression, refilled for her today She continues to take pantoprazole  for GERD, refilled Blood pressure under good control She will look into tirzepatide through the [employer] direct program   Signed Harlene Schroeder, MD Received labs as below, message to pt  Results for orders placed or performed in visit on 07/30/24  CBC   Collection Time: 07/30/24  9:33 AM  Result Value Ref Range   WBC 5.5 4.0 - 10.5 K/uL   RBC 3.90 3.87 - 5.11 Mil/uL   Platelets 305.0 150.0 - 400.0 K/uL   Hemoglobin 12.6 12.0 - 15.0 g/dL   HCT 62.8 63.9 - 53.9 %   MCV 95.0  78.0 - 100.0 fl   MCHC 34.1 30.0 - 36.0 g/dL   RDW 87.2 88.4 - 84.4 %  Comprehensive metabolic panel with GFR   Collection Time: 07/30/24  9:33 AM  Result Value Ref Range   Sodium 139 135 - 145 mEq/L   Potassium 4.5 3.5 - 5.1 mEq/L   Chloride 106 96 - 112 mEq/L   CO2 27 19 - 32 mEq/L   Glucose, Bld 114 (H) 70 - 99 mg/dL   BUN 12 6 - 23 mg/dL   Creatinine, Ser 9.33  0.40 - 1.20 mg/dL   Total Bilirubin 0.5 0.2 - 1.2 mg/dL   Alkaline Phosphatase 66 39 - 117 U/L   AST 16 0 - 37 U/L   ALT 26 0 - 35 U/L   Total Protein 6.7 6.0 - 8.3 g/dL   Albumin 4.3 3.5 - 5.2 g/dL   GFR 02.59 >39.99 mL/min   Calcium  8.8 8.4 - 10.5 mg/dL  Hemoglobin J8r   Collection Time: 07/30/24  9:33 AM  Result Value Ref Range   Hgb A1c MFr Bld 5.9 4.6 - 6.5 %  Lipid panel   Collection Time: 07/30/24  9:33 AM  Result Value Ref Range   Cholesterol 158 0 - 200 mg/dL   Triglycerides 899.9 0.0 - 149.0 mg/dL   HDL 38.59 >60.99 mg/dL   VLDL 79.9 0.0 - 59.9 mg/dL   LDL Cholesterol 77 0 - 99 mg/dL   Total CHOL/HDL Ratio 3    NonHDL 96.51   TSH   Collection Time: 07/30/24  9:33 AM  Result Value Ref Range   TSH 0.95 0.35 - 5.50 uIU/mL

## 2024-07-29 NOTE — Patient Instructions (Incomplete)
 It was great to see you again today I will be in touch with your labs Recommend a COVID booster this fall/winter if you are able to get one- wait about 3 months since last illness Flu shot today  We might look into trying Tirzepetide for you for weight loss.  If you like check out the [employer] and see if this might be affordable for you Otherwise keep working on healthy diet and exercise as you are able!

## 2024-07-30 ENCOUNTER — Encounter: Payer: Self-pay | Admitting: Family Medicine

## 2024-07-30 ENCOUNTER — Ambulatory Visit (INDEPENDENT_AMBULATORY_CARE_PROVIDER_SITE_OTHER): Admitting: Family Medicine

## 2024-07-30 VITALS — BP 113/69 | HR 98 | Ht 66.0 in | Wt 225.0 lb

## 2024-07-30 DIAGNOSIS — Z0000 Encounter for general adult medical examination without abnormal findings: Secondary | ICD-10-CM

## 2024-07-30 DIAGNOSIS — K219 Gastro-esophageal reflux disease without esophagitis: Secondary | ICD-10-CM

## 2024-07-30 DIAGNOSIS — R931 Abnormal findings on diagnostic imaging of heart and coronary circulation: Secondary | ICD-10-CM

## 2024-07-30 DIAGNOSIS — Z1329 Encounter for screening for other suspected endocrine disorder: Secondary | ICD-10-CM | POA: Diagnosis not present

## 2024-07-30 DIAGNOSIS — R7303 Prediabetes: Secondary | ICD-10-CM | POA: Diagnosis not present

## 2024-07-30 DIAGNOSIS — F325 Major depressive disorder, single episode, in full remission: Secondary | ICD-10-CM

## 2024-07-30 DIAGNOSIS — Z23 Encounter for immunization: Secondary | ICD-10-CM | POA: Diagnosis not present

## 2024-07-30 DIAGNOSIS — I10 Essential (primary) hypertension: Secondary | ICD-10-CM

## 2024-07-30 DIAGNOSIS — Z131 Encounter for screening for diabetes mellitus: Secondary | ICD-10-CM

## 2024-07-30 DIAGNOSIS — E785 Hyperlipidemia, unspecified: Secondary | ICD-10-CM | POA: Diagnosis not present

## 2024-07-30 LAB — COMPREHENSIVE METABOLIC PANEL WITH GFR
ALT: 26 U/L (ref 0–35)
AST: 16 U/L (ref 0–37)
Albumin: 4.3 g/dL (ref 3.5–5.2)
Alkaline Phosphatase: 66 U/L (ref 39–117)
BUN: 12 mg/dL (ref 6–23)
CO2: 27 meq/L (ref 19–32)
Calcium: 8.8 mg/dL (ref 8.4–10.5)
Chloride: 106 meq/L (ref 96–112)
Creatinine, Ser: 0.66 mg/dL (ref 0.40–1.20)
GFR: 97.4 mL/min (ref >60.00)
Glucose, Bld: 114 mg/dL — ABNORMAL HIGH (ref 70–99)
Potassium: 4.5 meq/L (ref 3.5–5.1)
Sodium: 139 meq/L (ref 135–145)
Total Bilirubin: 0.5 mg/dL (ref 0.2–1.2)
Total Protein: 6.7 g/dL (ref 6.0–8.3)

## 2024-07-30 LAB — LIPID PANEL
Cholesterol: 158 mg/dL (ref 0–200)
HDL: 61.4 mg/dL (ref >39.00)
LDL Cholesterol: 77 mg/dL (ref 0–99)
NonHDL: 96.51
Total CHOL/HDL Ratio: 3
Triglycerides: 100 mg/dL (ref 0.0–149.0)
VLDL: 20 mg/dL (ref 0.0–40.0)

## 2024-07-30 LAB — CBC
HCT: 37.1 % (ref 36.0–46.0)
Hemoglobin: 12.6 g/dL (ref 12.0–15.0)
MCHC: 34.1 g/dL (ref 30.0–36.0)
MCV: 95 fl (ref 78.0–100.0)
Platelets: 305 K/uL (ref 150.0–400.0)
RBC: 3.9 Mil/uL (ref 3.87–5.11)
RDW: 12.7 % (ref 11.5–15.5)
WBC: 5.5 K/uL (ref 4.0–10.5)

## 2024-07-30 LAB — TSH: TSH: 0.95 u[IU]/mL (ref 0.35–5.50)

## 2024-07-30 LAB — HEMOGLOBIN A1C: Hgb A1c MFr Bld: 5.9 % (ref 4.6–6.5)

## 2024-07-30 MED ORDER — VENLAFAXINE HCL ER 150 MG PO CP24: 150.0000 mg | ORAL_CAPSULE | Freq: Every day | ORAL | 3 refills | Status: AC

## 2024-07-30 MED ORDER — LOSARTAN POTASSIUM 100 MG PO TABS: ORAL_TABLET | ORAL | 3 refills | Status: DC

## 2024-07-30 MED ORDER — PANTOPRAZOLE SODIUM 40 MG PO TBEC: 40.0000 mg | DELAYED_RELEASE_TABLET | Freq: Every day | ORAL | 3 refills | Status: AC

## 2024-09-09 DIAGNOSIS — Z01419 Encounter for gynecological examination (general) (routine) without abnormal findings: Secondary | ICD-10-CM | POA: Diagnosis not present

## 2024-09-09 DIAGNOSIS — Z6837 Body mass index (BMI) 37.0-37.9, adult: Secondary | ICD-10-CM | POA: Diagnosis not present

## 2024-09-09 DIAGNOSIS — N951 Menopausal and female climacteric states: Secondary | ICD-10-CM | POA: Diagnosis not present

## 2024-09-09 DIAGNOSIS — Z124 Encounter for screening for malignant neoplasm of cervix: Secondary | ICD-10-CM | POA: Diagnosis not present

## 2024-09-09 DIAGNOSIS — L820 Inflamed seborrheic keratosis: Secondary | ICD-10-CM | POA: Diagnosis not present

## 2024-09-09 DIAGNOSIS — L738 Other specified follicular disorders: Secondary | ICD-10-CM | POA: Diagnosis not present

## 2024-09-10 ENCOUNTER — Other Ambulatory Visit: Payer: Self-pay | Admitting: Family Medicine

## 2024-09-10 DIAGNOSIS — I10 Essential (primary) hypertension: Secondary | ICD-10-CM

## 2024-09-24 DIAGNOSIS — Z1231 Encounter for screening mammogram for malignant neoplasm of breast: Secondary | ICD-10-CM | POA: Diagnosis not present

## 2024-09-26 ENCOUNTER — Encounter: Payer: Self-pay | Admitting: Family Medicine

## 2024-09-26 DIAGNOSIS — I10 Essential (primary) hypertension: Secondary | ICD-10-CM

## 2024-09-26 MED ORDER — LOSARTAN POTASSIUM 50 MG PO TABS: ORAL_TABLET | ORAL | 3 refills | Status: AC

## 2024-11-17 ENCOUNTER — Telehealth: Payer: Self-pay | Admitting: Internal Medicine

## 2024-11-17 DIAGNOSIS — E782 Mixed hyperlipidemia: Secondary | ICD-10-CM

## 2024-11-17 NOTE — Telephone Encounter (Signed)
" °*  STAT* If patient is at the pharmacy, call can be transferred to refill team.   1. Which medications need to be refilled? (please list name of each medication and dose if known) Evolocumab  (REPATHA  SURECLICK) 140 MG/ML SOAJ    2. Would you like to learn more about the convenience, safety, & potential cost savings by using the [facility]?  3. Are you open to using the [facility] (Type [facility]. ).   4. Which pharmacy/location (including street and city if local pharmacy) is medication to be sent to? [id number] - [place], [place] - [address]    5. Do they need a 30 day or 90 day supply? 30  "

## 2024-11-18 MED ORDER — REPATHA SURECLICK 140 MG/ML ~~LOC~~ SOAJ: 1.0000 mL | SUBCUTANEOUS | 0 refills | Status: AC

## 2025-02-02 ENCOUNTER — Ambulatory Visit: Admitting: Internal Medicine
